# Patient Record
Sex: Male | Born: 1940 | Race: White | Hispanic: No | Smoking: Former smoker
Health system: Southern US, Community
[De-identification: ages and names within clinical notes are randomized; demographics above are authoritative.]

## PROBLEM LIST (undated history)

## (undated) DIAGNOSIS — E039 Hypothyroidism, unspecified: Secondary | ICD-10-CM

## (undated) DIAGNOSIS — E785 Hyperlipidemia, unspecified: Secondary | ICD-10-CM

## (undated) DIAGNOSIS — R413 Other amnesia: Secondary | ICD-10-CM

## (undated) DIAGNOSIS — R609 Edema, unspecified: Secondary | ICD-10-CM

## (undated) DIAGNOSIS — I639 Cerebral infarction, unspecified: Secondary | ICD-10-CM

## (undated) DIAGNOSIS — R600 Localized edema: Secondary | ICD-10-CM

## (undated) DIAGNOSIS — I2510 Atherosclerotic heart disease of native coronary artery without angina pectoris: Secondary | ICD-10-CM

## (undated) DIAGNOSIS — I10 Essential (primary) hypertension: Secondary | ICD-10-CM

## (undated) DIAGNOSIS — R4182 Altered mental status, unspecified: Secondary | ICD-10-CM

## (undated) DIAGNOSIS — E119 Type 2 diabetes mellitus without complications: Secondary | ICD-10-CM

## (undated) HISTORY — DX: Other amnesia: R41.3

## (undated) HISTORY — DX: Atherosclerotic heart disease of native coronary artery without angina pectoris: I25.10

## (undated) HISTORY — DX: Hypothyroidism, unspecified: E03.9

## (undated) HISTORY — DX: Hyperlipidemia, unspecified: E78.5

## (undated) HISTORY — DX: Edema, unspecified: R60.9

## (undated) HISTORY — PX: CORONARY STENT PLACEMENT: SHX1402

## (undated) HISTORY — DX: Type 2 diabetes mellitus without complications: E11.9

## (undated) HISTORY — DX: Essential (primary) hypertension: I10

## (undated) HISTORY — DX: Altered mental status, unspecified: R41.82

## (undated) HISTORY — DX: Cerebral infarction, unspecified: I63.9

## (undated) HISTORY — DX: Localized edema: R60.0

---

## 2013-09-21 DIAGNOSIS — N390 Urinary tract infection, site not specified: Secondary | ICD-10-CM | POA: Diagnosis not present

## 2013-09-21 DIAGNOSIS — R82998 Other abnormal findings in urine: Secondary | ICD-10-CM | POA: Diagnosis not present

## 2013-09-21 DIAGNOSIS — E785 Hyperlipidemia, unspecified: Secondary | ICD-10-CM | POA: Diagnosis not present

## 2013-09-21 DIAGNOSIS — E119 Type 2 diabetes mellitus without complications: Secondary | ICD-10-CM | POA: Diagnosis not present

## 2013-09-21 DIAGNOSIS — I10 Essential (primary) hypertension: Secondary | ICD-10-CM | POA: Diagnosis not present

## 2013-09-21 DIAGNOSIS — Z79899 Other long term (current) drug therapy: Secondary | ICD-10-CM | POA: Diagnosis not present

## 2013-10-17 DIAGNOSIS — R82998 Other abnormal findings in urine: Secondary | ICD-10-CM | POA: Diagnosis not present

## 2013-10-17 DIAGNOSIS — I2510 Atherosclerotic heart disease of native coronary artery without angina pectoris: Secondary | ICD-10-CM | POA: Diagnosis not present

## 2013-10-17 DIAGNOSIS — Z79899 Other long term (current) drug therapy: Secondary | ICD-10-CM | POA: Diagnosis not present

## 2013-11-14 DIAGNOSIS — Z0000 Encounter for general adult medical examination without abnormal findings: Secondary | ICD-10-CM | POA: Diagnosis not present

## 2013-11-14 DIAGNOSIS — N401 Enlarged prostate with lower urinary tract symptoms: Secondary | ICD-10-CM | POA: Diagnosis not present

## 2013-11-14 DIAGNOSIS — N390 Urinary tract infection, site not specified: Secondary | ICD-10-CM | POA: Diagnosis not present

## 2014-05-23 DIAGNOSIS — Z79899 Other long term (current) drug therapy: Secondary | ICD-10-CM | POA: Diagnosis not present

## 2014-05-23 DIAGNOSIS — R5383 Other fatigue: Secondary | ICD-10-CM | POA: Diagnosis not present

## 2014-05-23 DIAGNOSIS — I6350 Cerebral infarction due to unspecified occlusion or stenosis of unspecified cerebral artery: Secondary | ICD-10-CM | POA: Diagnosis not present

## 2014-05-23 DIAGNOSIS — I10 Essential (primary) hypertension: Secondary | ICD-10-CM | POA: Diagnosis not present

## 2014-05-23 DIAGNOSIS — R5381 Other malaise: Secondary | ICD-10-CM | POA: Diagnosis not present

## 2014-05-23 DIAGNOSIS — E119 Type 2 diabetes mellitus without complications: Secondary | ICD-10-CM | POA: Diagnosis not present

## 2014-05-30 DIAGNOSIS — I2510 Atherosclerotic heart disease of native coronary artery without angina pectoris: Secondary | ICD-10-CM | POA: Diagnosis not present

## 2014-05-30 DIAGNOSIS — I669 Occlusion and stenosis of unspecified cerebral artery: Secondary | ICD-10-CM | POA: Diagnosis not present

## 2014-05-30 DIAGNOSIS — E1121 Type 2 diabetes mellitus with diabetic nephropathy: Secondary | ICD-10-CM | POA: Diagnosis not present

## 2014-05-30 DIAGNOSIS — I10 Essential (primary) hypertension: Secondary | ICD-10-CM | POA: Diagnosis not present

## 2014-08-28 DIAGNOSIS — N183 Chronic kidney disease, stage 3 (moderate): Secondary | ICD-10-CM | POA: Diagnosis not present

## 2014-08-28 DIAGNOSIS — E039 Hypothyroidism, unspecified: Secondary | ICD-10-CM | POA: Diagnosis not present

## 2014-11-12 DIAGNOSIS — I2510 Atherosclerotic heart disease of native coronary artery without angina pectoris: Secondary | ICD-10-CM | POA: Diagnosis not present

## 2014-11-12 DIAGNOSIS — E039 Hypothyroidism, unspecified: Secondary | ICD-10-CM | POA: Diagnosis not present

## 2014-11-12 DIAGNOSIS — I10 Essential (primary) hypertension: Secondary | ICD-10-CM | POA: Diagnosis not present

## 2014-11-12 DIAGNOSIS — R609 Edema, unspecified: Secondary | ICD-10-CM | POA: Diagnosis not present

## 2014-11-12 DIAGNOSIS — E785 Hyperlipidemia, unspecified: Secondary | ICD-10-CM | POA: Diagnosis not present

## 2014-11-12 DIAGNOSIS — E119 Type 2 diabetes mellitus without complications: Secondary | ICD-10-CM | POA: Diagnosis not present

## 2014-11-19 ENCOUNTER — Telehealth (HOSPITAL_COMMUNITY): Payer: Self-pay | Admitting: Cardiovascular Disease

## 2014-11-19 NOTE — Telephone Encounter (Signed)
Records received from [facility] for appointment on 12/18/14 with Dr Allyson SabalBerry.  [facility] for Dr Hazle CocaBerry's schedule on 12/18/14. lp

## 2014-12-18 ENCOUNTER — Telehealth: Payer: Self-pay | Admitting: Cardiovascular Disease

## 2014-12-18 ENCOUNTER — Ambulatory Visit (INDEPENDENT_AMBULATORY_CARE_PROVIDER_SITE_OTHER): Payer: Medicare Other | Admitting: Cardiovascular Disease

## 2014-12-18 ENCOUNTER — Encounter: Payer: Self-pay | Admitting: Cardiovascular Disease

## 2014-12-18 VITALS — BP 132/72 | HR 58 | Ht 68.0 in | Wt 192.4 lb

## 2014-12-18 DIAGNOSIS — I2510 Atherosclerotic heart disease of native coronary artery without angina pectoris: Secondary | ICD-10-CM | POA: Diagnosis not present

## 2014-12-18 DIAGNOSIS — E785 Hyperlipidemia, unspecified: Secondary | ICD-10-CM | POA: Insufficient documentation

## 2014-12-18 DIAGNOSIS — I10 Essential (primary) hypertension: Secondary | ICD-10-CM | POA: Insufficient documentation

## 2014-12-18 DIAGNOSIS — I2583 Coronary atherosclerosis due to lipid rich plaque: Secondary | ICD-10-CM

## 2014-12-18 NOTE — Telephone Encounter (Signed)
Faxed signed Release to [facility] to obtain records per Dr Allyson SabalBerry request.  Faxed on 12/18/14. lp

## 2014-12-18 NOTE — Assessment & Plan Note (Signed)
History of hypertension with blood pressure measured at 132/72. He is on amlodipine, carvedilol and enalapril. Continue current meds at current dosing

## 2014-12-18 NOTE — Progress Notes (Signed)
12/18/2014 Leroy Rodriguez   1941/02/05  [id number]  Primary Physician No primary care provider on file. Primary Cardiologist: Leroy GessJonathan J. Teruko Joswick MD Leroy Rodriguez,FACC,FAHA, FSCAI   HPI:  Leroy Rodriguez is a 74 year old moderately overweight widowed Caucasian male father of 2 children (daughter [name] , son Leroy Rodriguez both of whom accompany him today), grandfather of 8 grandchildren who was referred for cardiovascular evaluation to be established practice. He has a history of coronary artery stenting on 2 separate occasions in 2010 by Dr. Juanita [facility]. His cardiac risk factors include treated hypertension, diabetes and hyperlipidemia. There is no family history. He has never had a heart attack or stroke. He denies chest pain or shortness of breath. He does have memory loss and may have vascular dementia which needs to be worked up.   Current Outpatient Prescriptions  Medication Sig Dispense Refill  . amLODipine (NORVASC) 10 MG tablet Take 1 tablet by mouth daily.    . carvedilol (COREG) 12.5 MG tablet Take 1 tablet by mouth daily.    . clopidogrel (PLAVIX) 75 MG tablet Take 1 tablet by mouth daily.  2  . enalapril (VASOTEC) 20 MG tablet Take 1 tablet by mouth daily.    . furosemide (LASIX) 20 MG tablet Take 1 tablet by mouth daily as needed.    Marland Kitchen. glimepiride (AMARYL) 2 MG tablet Take 1 tablet by mouth daily.    Marland Kitchen. levothyroxine (SYNTHROID, LEVOTHROID) 25 MCG tablet Take 1 tablet by mouth daily.  0  . potassium chloride (MICRO-K) 10 MEQ CR capsule Take 1 capsule by mouth daily.    . pravastatin (PRAVACHOL) 80 MG tablet Take 1 tablet by mouth daily.     No current facility-administered medications for this visit.    No Known Allergies  History   Social History  . Marital Status: Widowed    Spouse Name: N/A  . Number of Children: N/A  . Years of Education: N/A   Occupational History  . Not on file.   Social History Main Topics  . Smoking status: Former Smoker    Quit date: 08/31/1963    . Smokeless tobacco: Not on file  . Alcohol Use: No  . Drug Use: No  . Sexual Activity: Not on file   Other Topics Concern  . Not on file   Social History Narrative  . No narrative on file     Review of Systems: General: negative for chills, fever, night sweats or weight changes.  Cardiovascular: negative for chest pain, dyspnea on exertion, edema, orthopnea, palpitations, paroxysmal nocturnal dyspnea or shortness of breath Dermatological: negative for rash Respiratory: negative for cough or wheezing Urologic: negative for hematuria Abdominal: negative for nausea, vomiting, diarrhea, bright red blood per rectum, melena, or hematemesis Neurologic: negative for visual changes, syncope, or dizziness All other systems reviewed and are otherwise negative except as noted above.    Blood pressure 132/72, pulse 58, height 5\' 8"  (1.727 m), weight 192 lb 6.4 oz (87.272 kg).  General appearance: alert and no distress Neck: no adenopathy, no carotid bruit, no JVD, supple, symmetrical, trachea midline and thyroid not enlarged, symmetric, no tenderness/mass/nodules Lungs: clear to auscultation bilaterally Heart: regular rate and rhythm, S1, S2 normal, no murmur, click, rub or gallop Extremities: extremities normal, atraumatic, no cyanosis or edema  EKG sinus bradycardia at 58 without ST or T-wave changes. I placed a reviewed this EKG  ASSESSMENT AND PLAN:   Hyperlipidemia History of hyperlipidemia on pravastatin 80 mg a day followed by his  PCP   Essential hypertension History of hypertension with blood pressure measured at 132/72. He is on amlodipine, carvedilol and enalapril. Continue current meds at current dosing   Coronary artery disease History of coronary artery disease status post intervention in [place] by Dr. Juanita Laster  in 2010 with 2 stents placed. He has not had a myocardial infarction by his account. He denies chest pain or shortness of breath. We will obtain his  procedure records for documentation. He is on dual antibiotic therapy in addition to a beta blocker.       Leroy Gess MD FACP,FACC,FAHA, [facility] 12/18/2014 3:42 PM

## 2014-12-18 NOTE — Assessment & Plan Note (Signed)
History of coronary artery disease status post intervention in [place] by Dr. Juanita LasterWalmeyer  in 2010 with 2 stents placed. He has not had a myocardial infarction by his account. He denies chest pain or shortness of breath. We will obtain his procedure records for documentation. He is on dual antibiotic therapy in addition to a beta blocker.

## 2014-12-18 NOTE — Patient Instructions (Signed)
Dr Berry recommends that you schedule a follow-up appointment in 1 year. You will receive a reminder letter in the mail two months in advance. If you don't receive a letter, please call our office to schedule the follow-up appointment. 

## 2014-12-18 NOTE — Assessment & Plan Note (Signed)
History of hyperlipidemia on pravastatin 80 mg a day followed by his PCP 

## 2014-12-19 ENCOUNTER — Telehealth: Payer: Self-pay | Admitting: Cardiovascular Disease

## 2014-12-19 NOTE — Telephone Encounter (Signed)
Received records from [facility] per request by Dr Allyson SabalBerry on 12/18/14.  Records given to Dr Allyson SabalBerry for review.

## 2015-01-07 DIAGNOSIS — E785 Hyperlipidemia, unspecified: Secondary | ICD-10-CM | POA: Diagnosis not present

## 2015-01-07 DIAGNOSIS — R413 Other amnesia: Secondary | ICD-10-CM | POA: Diagnosis not present

## 2015-01-07 DIAGNOSIS — R609 Edema, unspecified: Secondary | ICD-10-CM | POA: Diagnosis not present

## 2015-01-07 DIAGNOSIS — I2510 Atherosclerotic heart disease of native coronary artery without angina pectoris: Secondary | ICD-10-CM | POA: Diagnosis not present

## 2015-01-07 DIAGNOSIS — I10 Essential (primary) hypertension: Secondary | ICD-10-CM | POA: Diagnosis not present

## 2015-01-07 DIAGNOSIS — E119 Type 2 diabetes mellitus without complications: Secondary | ICD-10-CM | POA: Diagnosis not present

## 2015-01-07 DIAGNOSIS — R4182 Altered mental status, unspecified: Secondary | ICD-10-CM | POA: Diagnosis not present

## 2015-01-07 DIAGNOSIS — E039 Hypothyroidism, unspecified: Secondary | ICD-10-CM | POA: Diagnosis not present

## 2015-01-09 ENCOUNTER — Other Ambulatory Visit: Payer: Self-pay | Admitting: Family Medicine

## 2015-01-09 DIAGNOSIS — R4182 Altered mental status, unspecified: Secondary | ICD-10-CM

## 2015-01-18 ENCOUNTER — Ambulatory Visit
Admission: RE | Admit: 2015-01-18 | Discharge: 2015-01-18 | Disposition: A | Discharge status: Home / Self Care | Payer: Medicare Other | Source: Ambulatory Visit | Attending: Family Medicine | Admitting: Family Medicine

## 2015-01-18 DIAGNOSIS — R4182 Altered mental status, unspecified: Secondary | ICD-10-CM

## 2015-01-18 DIAGNOSIS — R410 Disorientation, unspecified: Secondary | ICD-10-CM | POA: Diagnosis not present

## 2015-01-18 DIAGNOSIS — R413 Other amnesia: Secondary | ICD-10-CM | POA: Diagnosis not present

## 2015-01-29 ENCOUNTER — Encounter: Payer: Self-pay | Admitting: Neurology

## 2015-01-29 ENCOUNTER — Ambulatory Visit (INDEPENDENT_AMBULATORY_CARE_PROVIDER_SITE_OTHER): Payer: Medicare Other | Admitting: Neurology

## 2015-01-29 VITALS — BP 132/72 | HR 62 | Resp 16 | Ht 68.0 in | Wt 200.0 lb

## 2015-01-29 DIAGNOSIS — R29818 Other symptoms and signs involving the nervous system: Secondary | ICD-10-CM | POA: Diagnosis not present

## 2015-01-29 DIAGNOSIS — R2689 Other abnormalities of gait and mobility: Secondary | ICD-10-CM

## 2015-01-29 DIAGNOSIS — Z955 Presence of coronary angioplasty implant and graft: Secondary | ICD-10-CM

## 2015-01-29 DIAGNOSIS — I2510 Atherosclerotic heart disease of native coronary artery without angina pectoris: Secondary | ICD-10-CM | POA: Diagnosis not present

## 2015-01-29 DIAGNOSIS — F0151 Vascular dementia with behavioral disturbance: Secondary | ICD-10-CM | POA: Diagnosis not present

## 2015-01-29 MED ORDER — DONEPEZIL HCL 5 MG PO TABS: 5.0000 mg | ORAL_TABLET | Freq: Every day | ORAL | Status: DC

## 2015-01-29 NOTE — Progress Notes (Signed)
Subjective:    Patient ID: Leroy Rodriguez is a 74 y.o. male.  HPI     Huston Foley, MD, PhD [facility] [address], [address] [address] [place], [place] [address]  Dear Dr. Azucena Cecil,   I saw your patient, Leroy Rodriguez, upon your kind request in my neurologic clinic today for initial consultation of his memory loss, associated with behavioral changes. The patient is accompanied by his daughter, Leroy Rodriguez and son, Leroy Rodriguez today. As you know, [name] is a 74 year old right-handed gentleman with an underlying medical history of coronary artery disease, status post stent placement in 2010, hypertension, hyperlipidemia, hypothyroidism, diabetes, remote history of smoking, and history of obesity, who has had memory loss for the past few years. More recently, in the past few weeks he has had behavioral escalations including physically lashing out. I reviewed your office note from 01/07/2015 which you kindly included. He had blood work on 01/07/2015 which I reviewed: CBC with differential was unremarkable, CMP showed glucose of 59, chloride borderline at 108, and otherwise unremarkable findings. B12 level was 389, TSH 3.01, RPR nonreactive, hemoglobin A1c on 11/12/2014 was 5.7. In your office, his MMSE score on 01/07/2015 was 10 out of 30. He had a brain MRI without contrast on 01/18/2015: Impression moderate atrophy and moderate chronic microvascular ischemia, multiple areas of chronic microhemorrhage, no acute abnormality. In addition, personally reviewed the images through the PACS system and agree with the findings.  He denies hallucinations or paranoia.  He does not drink alcohol and never was a heavy drinker.  He snores occasionally. He does not have any apneic spells. He sleeps a lot during the day according to [place]. He lives with his daughter and her husband and Leroy Rodriguez lives with them as well. He does not drive. There may be a family history of Alzheimer's disease and perhaps  vascular dementia as well. This is on his mother's side and includes maternal aunts and his own sister who is younger. He has never had stroke symptoms but was told in the past that he had smaller strokes in the past. He has never been on dementia medications. His balance has become worse. Thankfully he has not fallen recently. He walks by holding onto things. He has been referred to physical therapy and has an appointment [facility] with [facility] later this month. He is divorced from his first wife. His second wife passed away about 3-4 years ago. This is when he started living with Leroy Rodriguez and they moved to live with Faith about 3 years ago.  His Past Medical History Is Significant For: Past Medical History  Diagnosis Date  . Coronary artery disease   . Hypertension   . Hyperlipidemia   . Type 2 diabetes mellitus   . Memory loss   . Stroke   . Hypothyroidism   . Hyperlipemia   . Peripheral edema   . Mental status change   . CAD (coronary artery disease)     His Past Surgical History Is Significant For: Past Surgical History  Procedure Laterality Date  . Coronary stent placement      His Family History Is Significant For: Family History  Problem Relation Age of Onset  . Stroke Mother   . Aneurysm Father     His Social History Is Significant For: History   Social History  . Marital Status: Widowed    Spouse Name: N/A  . Number of Children: 2  . Years of Education: [employer]  Occupational History  . Retired     Social History Main Topics  . Smoking status: Former Smoker    Quit date: 08/31/1963  . Smokeless tobacco: Not on file  . Alcohol Use: No  . Drug Use: No  . Sexual Activity: Not on file   Other Topics Concern  . None   Social History Narrative   Denies caffeine use    His Allergies Are:  No Known Allergies:   His Current Medications Are:  Outpatient Encounter Prescriptions as of 01/29/2015  Medication Sig  . amLODipine (NORVASC)  10 MG tablet Take 1 tablet by mouth daily.  . carvedilol (COREG) 12.5 MG tablet Take 1 tablet by mouth daily.  . clopidogrel (PLAVIX) 75 MG tablet Take 1 tablet by mouth daily.  . enalapril (VASOTEC) 20 MG tablet Take 1 tablet by mouth daily.  . furosemide (LASIX) 20 MG tablet Take 1 tablet by mouth daily as needed.  Marland Kitchen glimepiride (AMARYL) 2 MG tablet Take 1 tablet by mouth daily.  Marland Kitchen levothyroxine (SYNTHROID, LEVOTHROID) 25 MCG tablet Take 1 tablet by mouth daily.  . potassium chloride (MICRO-K) 10 MEQ CR capsule Take 1 capsule by mouth daily.  . pravastatin (PRAVACHOL) 80 MG tablet Take 1 tablet by mouth daily.   No facility-administered encounter medications on file as of 01/29/2015.  :  Review of Systems:  Out of a complete 14 point review of systems, all are reviewed and negative with the exception of these symptoms as listed below:   Review of Systems  Constitutional: Positive for fatigue.  Neurological:       Short term memory loss, confusion, sleepiness.   Hematological: Bruises/bleeds easily.    Objective:  Neurologic Exam  Physical Exam Physical Examination:   Filed Vitals:   01/29/15 1411  BP: 132/72  Pulse: 62  Resp: 16   General Examination: The patient is a very pleasant 74 y.o. male in no acute distress. He is calm and cooperative with the exam. He denies Auditory Hallucinations and Visual Hallucinations. He is well groomed and situated in a chair.   HEENT: Normocephalic, atraumatic, pupils are equal, round and reactive to light and accommodation. Funduscopic exam is normal with sharp disc margins noted. Extraocular tracking shows mild saccadic breakdown without nystagmus noted. Hearing is mildly impaired. Face is symmetric with mild facial masking and normal facial sensation. There is no lip, neck or jaw tremor. Neck is not rigid with intact passive ROM. There are no carotid bruits on auscultation. Oropharynx exam reveals mild mouth dryness. No significant airway  crowding is noted. He has no teeth in his upper jaw. He misplaced or lost his dentures. He has several missing teeth in his lower jaw with poor dental hygiene. Mallampati is class II. Tongue protrudes centrally and palate elevates symmetrically.    Chest: is clear to auscultation without wheezing, rhonchi or crackles noted.  Heart: sounds are regular and normal without murmurs, rubs or gallops noted.   Abdomen: is soft, non-tender and non-distended with normal bowel sounds appreciated on auscultation.  Extremities: There is 1+ pitting edema in the distal lower extremities bilaterally. Pedal pulses are intact.   Skin: is warm and dry with no trophic changes noted. Age-related changes are noted on the skin. Multiple small bruises of varying ages are noted on the forearms. Of note, He is on plavix.  Musculoskeletal: exam reveals no obvious joint deformities, tenderness or joint swelling or erythema. 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   Neurologically:  Mental status: The patient is awake and alert, paying good  attention. He is able to partially provide the history. His children provide most of his history. His is not able to give the exact date, the day of the week, the season, the month or the year. Memory, language and knowledge are impaired. There is no aphasia, agnosia, apraxia or anomia. There is a mild degree of bradyphrenia. Speech is mildly hypophonic with no dysarthria noted, but is edentulous sounding. Mood is congruent and affect is normal  On 01/29/2015: MMSE 20/30, CDT: 3/4, AFT: 5/min.   Cranial nerves are as described above under HEENT exam. In addition, shoulder shrug is normal with equal shoulder height noted.  Motor exam: Normal bulk, and strength for age is noted. Tone is normal. Fine motor skills are globally impaired, mildly. Reflexes are 1-2+ throughout. Romberg is not tested.   Sensory exam is intact to light touch, pinprick, vibration,  temperature sense in the upper and lower extremities.   Gait, station and balance: He stands up with difficulty. He pushes himself up. His posture is moderately stooped. He stands wide-based. He has difficulty walking. He is unstable and not able to do tandem walk. He cannot walk for more than a few steps without holding onto something. Balance is impaired.   Assessment and Plan:   In summary, Quatavious Rossa is a very pleasant 73 y.o.-year old male with an underlying medical history of coronary artery disease, status post stent placement in 2010, hypertension, hyperlipidemia, hypothyroidism, diabetes, remote history of smoking, and history of obesity, who has had memory loss for the past few years. Lately he has had some behavioral changes. given his medical history, he is at risk for vascular dementia. I had a long chat with the patient and his family about my findings and the diagnosis of memory loss as well as dementia and the prognosis and treatment options. While there is no specific treatment for vascular dementia, we sometimes try dementia medication and some patients improve. I did talk to him about potential side effects such as mouth dryness, blurry vision, hallucinations, worsening personality changes or behavioral changes, balance problems and GI problems as well as bradycardia. We will start low dose Aricept at 5 mg once daily. I provided them with instructions and a prescription. We will continue to monitor for behavioral changes and his memory loss. I reviewed his MRI scan. I explained the findings to them. I would like to add a carotid Doppler ultrasound and we will call his son with the results. He spends the most time with the patient. I would like to see him back routinely in about 3-4 months, sooner if the need arises. I've asked his son to give [place] an update over the phone or via my chart in about a month as to how the patient is tolerating the new medication. I reviewed your office note  and recent blood work in your office. He is encouraged to eat healthy, exercise daily and keep well hydrated, to keep a scheduled bedtime and wake time routine, to not skip any meals and eat healthy snacks in between meals and to have protein with every meal. I stressed the importance of regular exercise, within of course the patient's own mobility limitations. I encouraged the patient to keep up with current events by reading the news paper or watching the news and to do word puzzles, or if feasible, to go on [employer].  his balance is off. His gait disorder may be multifactorial. He  has been referred to physical therapy with an appointment pending for later this month. He may do better with a walker.  I answered all their questions today and the patient and his children were in agreement with the above outlined plan.  Thank you very much for allowing me to participate in the care of this nice patient. If I can be of any further assistance to you please do not hesitate to call me at [phone].  Sincerely,   Huston FoleySaima Samar Venneman, MD, PhD

## 2015-01-29 NOTE — Patient Instructions (Addendum)
We will do a carotid doppler ultrasound and call Scott with the results.  You are at risk for vascular dementia. We will try Aricept (generic name: donepezil) 5 mg: take one pill each evening. Common side effects include dry eyes, dry mouth, confusion, low pulse, low blood pressure and rare side effects include hallucinations.  Scott: please call in a month for an update, and we may be able to increase the dose to 10 mg daily, if tolerated.   I do want to suggest a few things today:  Remember to drink plenty of fluid, eat healthy meals and do not skip any meals. Try to eat protein with a every meal and eat a healthy snack such as fruit or nuts in between meals. Try to keep a regular sleep-wake schedule and try to exercise daily, particularly in the form of walking, 20-30 minutes a day, if you can. Good nutrition, proper sleep and exercise can help her cognitive function.  Look at [employer] website, they may have some options for socialization for seniors.   Engage in social activities in your community and with your family and try to keep up with current events by reading the newspaper or watching the news. If you have computer and can go online, try [employer]pllumosity[employer]com. Also, you may like to do word finding puzzles or crossword puzzles.  As far as diagnostic testing: carotid doppler study.   I would like to see you back in 3 to 4 months, sooner if we need to. Please call us with any interim questions, concerns, problems, updates or refill requests.  Our phone number is [phone]. We also have an after hours call service for urgent matters and there is a physician on-call for urgent questions. For any emergencies you know to call 911 or go to the nearest emergency room.

## 2015-02-05 ENCOUNTER — Telehealth: Payer: Self-pay

## 2015-02-05 NOTE — Telephone Encounter (Signed)
Called Patient, spoke with daughter and  scheduled US Carotid.  Family member verbalized understanding.

## 2015-02-20 ENCOUNTER — Ambulatory Visit (INDEPENDENT_AMBULATORY_CARE_PROVIDER_SITE_OTHER): Payer: Medicare Other

## 2015-02-20 ENCOUNTER — Ambulatory Visit: Payer: Medicare Other | Attending: Family Medicine | Admitting: Physical Therapy

## 2015-02-20 DIAGNOSIS — R269 Unspecified abnormalities of gait and mobility: Secondary | ICD-10-CM | POA: Insufficient documentation

## 2015-02-20 DIAGNOSIS — F0151 Vascular dementia with behavioral disturbance: Secondary | ICD-10-CM | POA: Diagnosis not present

## 2015-02-20 DIAGNOSIS — Z955 Presence of coronary angioplasty implant and graft: Secondary | ICD-10-CM

## 2015-02-21 ENCOUNTER — Encounter: Payer: Self-pay | Admitting: Physical Therapy

## 2015-02-21 NOTE — Therapy (Signed)
[facility] [address] [place], [place], [address] Phone: [phone]   Fax:  [phone]  Physical Therapy Evaluation  Patient Details  Name: Leroy Rodriguez MRN: [id number] Date of Birth: [birth year] Referring Provider:  Tally Joe, MD  Encounter Date: 02/20/2015      PT End of Session - 02/21/15 0904    Visit Number 1  G1   Number of Visits 8   Date for PT Re-Evaluation 03/22/15   Authorization Type Medicare   Authorization Time Period 02-20-15 - 04-21-15   Authorization - Number of Visits 8   PT Start Time 0932   PT Stop Time 1025   PT Time Calculation (min) 53 min   Equipment Utilized During Treatment Gait belt      Past Medical History  Diagnosis Date  . Coronary artery disease   . Hypertension   . Hyperlipidemia   . Type 2 diabetes mellitus   . Memory loss   . Stroke   . Hypothyroidism   . Hyperlipemia   . Peripheral edema   . Mental status change   . CAD (coronary artery disease)     Past Surgical History  Procedure Laterality Date  . Coronary stent placement      There were no vitals filed for this visit.  Visit Diagnosis:  Abnormality of gait - Plan: PT plan of care cert/re-cert      Subjective Assessment - 02/21/15 0900    Subjective Pt. is accompanied by his daughter and son - daughter reports they want to know what device is needed to help pt maintain balance and not fall; some confusion on referral as one requests wheelchair evaluation and one requests PT for unsrteady gait   Patient is accompained by: Family member  daughter and son   Pertinent History HTN:  vascular dementia   Patient Stated Goals obtain a RW and learn how to use correctly; pt. states he does not need a wheelchair at this time - his duaghter and son agree that only RW is needed - no w/c   Currently in Pain? 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 --  several episodes of unsteadiness   Has the patient had a decrease in activity level because of a fear of falling?  Yes   Is the patient reluctant to leave their home because of a fear of falling?  No   Home Environment   Living Environment Private residence   Living Arrangements Children  son provides 24 hr S   Type of Home House   Home Access Stairs to enter   Entrance Stairs-Number of Steps 4   Entrance Stairs-Rails Right   Home Layout One level   Home Equipment --  grab bar in bathroom   Strength   Overall Strength Within functional limits for tasks performed   Ambulation/Gait   Ambulation/Gait Yes   Ambulation/Gait Assistance 4: Min guard   Ambulation Distance (Feet) 225 Feet   Assistive device Rolling walker   Ambulation Surface Level;Indoor   Gait velocity --  28.65 secs with RW   Sharlene Motts Balance  Test   Sit to Stand Needs minimal aid to stand or to stabilize   Standing Unsupported Able to stand 2 minutes with supervision   Sitting with Back Unsupported but Feet Supported on Floor or Stool Able to sit safely and securely 2 minutes   Stand to Sit Sits safely with minimal use of hands   Transfers Able to transfer safely, definite need of hands   Standing Unsupported with Eyes Closed Able to stand 10 seconds with supervision   Standing Ubsupported with Feet Together Needs help to attain position but able to stand for 30 seconds with feet together   From Standing, Reach Forward with Outstretched Arm Can reach forward >12 cm safely (5")   From Standing Position, Pick up Object from Floor Unable to try/needs assist to keep balance   From Standing Position, Turn to Look Behind Over each Shoulder Turn sideways only but maintains balance   Turn  360 Degrees Needs assistance while turning   Standing Unsupported, Alternately Place Feet on Step/Stool Able to complete >2 steps/needs minimal assist   Standing Unsupported, One Foot in Front Able to take small step independently and hold 30 seconds   Standing on One Leg Unable to try or needs assist to prevent fall   Total Score 27   Timed Up and Go Test   Normal TUG (seconds) 27.69  with RW                                PT Long Term Goals - 02/21/15 0909    PT LONG TERM GOAL #1   Title Pt. will amb. 250' with RW with SBA for safety with household amb.  (03-22-15)   Time 4   Period Weeks   Status New   PT LONG TERM GOAL #2   Title Pt. will demonstrate correct hand placement with sit to/from transfers with RW with min verbal cues  (03-22-15)   Time 4   Period Weeks   Status New   PT LONG TERM GOAL #3   Title Perform HEP for balance with son's assistance  (03-22-15)   Time 4   Period Weeks   Status New               Plan - 02/21/15 4098    Clinical Impression Statement Pt. needs a RW to assist with balance to minimize fall risk during gait; pt. does not require a wheelchair at this time due to pt is able to amb. in his home safely with use of RW   Pt will benefit from skilled therapeutic intervention in order to improve on the following deficits Abnormal gait;Decreased activity tolerance;Decreased balance;Decreased cognition;Decreased mobility;Decreased knowledge of use of DME;Decreased safety awareness   Rehab Potential Good   PT Frequency 2x / week   PT Duration 4 weeks   PT Treatment/Interventions ADLs/Self Care Home Management;Functional mobility training;Stair training;Gait training;Therapeutic activities;Therapeutic exercise;Balance training;Neuromuscular re-education;Patient/family education   PT Next Visit Plan gait train with RW; balance HEP   PT Home Exercise Plan balance   Consulted and Agree with Plan of Care Patient;Family  member/caregiver   Family Member Consulted daughter and son          G-Codes - 03-05-2015 0912    Functional Assessment Tool Used Sharlene Motts score 27/56;  TUG with RW 27.69 secs;  pt. currently does not have an asst. device - is at high risk for falls   Functional Limitation Mobility:  Walking and moving around   Mobility: Walking and Moving Around Current Status [phone]) At least 60 percent but less than 80 percent impaired, limited or restricted   Mobility: Walking and Moving Around Goal Status [phone]) At least 40 percent but less than 60 percent impaired, limited or restricted       Problem List Patient Active Problem List   Diagnosis Date Noted  . Coronary artery disease 12/18/2014  . Essential hypertension 12/18/2014  . Hyperlipidemia 12/18/2014    Kary Kos, PT 02/21/2015, 9:18 AM  [facility] [address] [place], [place], [address] Phone: [phone]   Fax:  [phone]

## 2015-03-17 ENCOUNTER — Telehealth: Payer: Self-pay | Admitting: Neurology

## 2015-03-17 ENCOUNTER — Telehealth: Payer: Self-pay

## 2015-03-17 NOTE — Telephone Encounter (Signed)
Left message on vm number provided with results below. Left call back number for further questions.

## 2015-03-17 NOTE — Telephone Encounter (Signed)
Please call pt, his daughter or his son, re: Carotid Doppler results. Study suggests no significant carotid artery disease, which is reassuring. No further action required on this test at this time.

## 2015-03-17 NOTE — Telephone Encounter (Signed)
Faith, patient's daughter, just wanted to let you know that they have seen some positive changes in [place] since starting Aricept. They feel no need for dosage change at this time.

## 2015-03-18 DIAGNOSIS — E119 Type 2 diabetes mellitus without complications: Secondary | ICD-10-CM | POA: Diagnosis not present

## 2015-03-18 DIAGNOSIS — Z8673 Personal history of transient ischemic attack (TIA), and cerebral infarction without residual deficits: Secondary | ICD-10-CM | POA: Diagnosis not present

## 2015-03-18 DIAGNOSIS — I10 Essential (primary) hypertension: Secondary | ICD-10-CM | POA: Diagnosis not present

## 2015-03-18 DIAGNOSIS — R2689 Other abnormalities of gait and mobility: Secondary | ICD-10-CM | POA: Diagnosis not present

## 2015-03-18 DIAGNOSIS — F0150 Vascular dementia without behavioral disturbance: Secondary | ICD-10-CM | POA: Diagnosis not present

## 2015-03-18 DIAGNOSIS — I2510 Atherosclerotic heart disease of native coronary artery without angina pectoris: Secondary | ICD-10-CM | POA: Diagnosis not present

## 2015-03-20 DIAGNOSIS — Z8673 Personal history of transient ischemic attack (TIA), and cerebral infarction without residual deficits: Secondary | ICD-10-CM | POA: Diagnosis not present

## 2015-03-20 DIAGNOSIS — I10 Essential (primary) hypertension: Secondary | ICD-10-CM | POA: Diagnosis not present

## 2015-03-20 DIAGNOSIS — E119 Type 2 diabetes mellitus without complications: Secondary | ICD-10-CM | POA: Diagnosis not present

## 2015-03-20 DIAGNOSIS — I2510 Atherosclerotic heart disease of native coronary artery without angina pectoris: Secondary | ICD-10-CM | POA: Diagnosis not present

## 2015-03-20 DIAGNOSIS — F0150 Vascular dementia without behavioral disturbance: Secondary | ICD-10-CM | POA: Diagnosis not present

## 2015-03-20 DIAGNOSIS — R2689 Other abnormalities of gait and mobility: Secondary | ICD-10-CM | POA: Diagnosis not present

## 2015-03-25 DIAGNOSIS — I10 Essential (primary) hypertension: Secondary | ICD-10-CM | POA: Diagnosis not present

## 2015-03-25 DIAGNOSIS — F0150 Vascular dementia without behavioral disturbance: Secondary | ICD-10-CM | POA: Diagnosis not present

## 2015-03-25 DIAGNOSIS — R2689 Other abnormalities of gait and mobility: Secondary | ICD-10-CM | POA: Diagnosis not present

## 2015-03-25 DIAGNOSIS — I2510 Atherosclerotic heart disease of native coronary artery without angina pectoris: Secondary | ICD-10-CM | POA: Diagnosis not present

## 2015-03-25 DIAGNOSIS — E119 Type 2 diabetes mellitus without complications: Secondary | ICD-10-CM | POA: Diagnosis not present

## 2015-03-25 DIAGNOSIS — Z8673 Personal history of transient ischemic attack (TIA), and cerebral infarction without residual deficits: Secondary | ICD-10-CM | POA: Diagnosis not present

## 2015-03-27 DIAGNOSIS — F0150 Vascular dementia without behavioral disturbance: Secondary | ICD-10-CM | POA: Diagnosis not present

## 2015-03-27 DIAGNOSIS — E119 Type 2 diabetes mellitus without complications: Secondary | ICD-10-CM | POA: Diagnosis not present

## 2015-03-27 DIAGNOSIS — Z8673 Personal history of transient ischemic attack (TIA), and cerebral infarction without residual deficits: Secondary | ICD-10-CM | POA: Diagnosis not present

## 2015-03-27 DIAGNOSIS — I10 Essential (primary) hypertension: Secondary | ICD-10-CM | POA: Diagnosis not present

## 2015-03-27 DIAGNOSIS — R2689 Other abnormalities of gait and mobility: Secondary | ICD-10-CM | POA: Diagnosis not present

## 2015-03-27 DIAGNOSIS — I2510 Atherosclerotic heart disease of native coronary artery without angina pectoris: Secondary | ICD-10-CM | POA: Diagnosis not present

## 2015-03-31 DIAGNOSIS — Z8673 Personal history of transient ischemic attack (TIA), and cerebral infarction without residual deficits: Secondary | ICD-10-CM | POA: Diagnosis not present

## 2015-03-31 DIAGNOSIS — F0150 Vascular dementia without behavioral disturbance: Secondary | ICD-10-CM | POA: Diagnosis not present

## 2015-03-31 DIAGNOSIS — I10 Essential (primary) hypertension: Secondary | ICD-10-CM | POA: Diagnosis not present

## 2015-03-31 DIAGNOSIS — I2510 Atherosclerotic heart disease of native coronary artery without angina pectoris: Secondary | ICD-10-CM | POA: Diagnosis not present

## 2015-03-31 DIAGNOSIS — R2689 Other abnormalities of gait and mobility: Secondary | ICD-10-CM | POA: Diagnosis not present

## 2015-03-31 DIAGNOSIS — E119 Type 2 diabetes mellitus without complications: Secondary | ICD-10-CM | POA: Diagnosis not present

## 2015-04-01 DIAGNOSIS — I10 Essential (primary) hypertension: Secondary | ICD-10-CM | POA: Diagnosis not present

## 2015-04-01 DIAGNOSIS — I2510 Atherosclerotic heart disease of native coronary artery without angina pectoris: Secondary | ICD-10-CM | POA: Diagnosis not present

## 2015-04-01 DIAGNOSIS — E119 Type 2 diabetes mellitus without complications: Secondary | ICD-10-CM | POA: Diagnosis not present

## 2015-04-01 DIAGNOSIS — Z8673 Personal history of transient ischemic attack (TIA), and cerebral infarction without residual deficits: Secondary | ICD-10-CM | POA: Diagnosis not present

## 2015-04-01 DIAGNOSIS — F0150 Vascular dementia without behavioral disturbance: Secondary | ICD-10-CM | POA: Diagnosis not present

## 2015-04-01 DIAGNOSIS — R2689 Other abnormalities of gait and mobility: Secondary | ICD-10-CM | POA: Diagnosis not present

## 2015-04-04 DIAGNOSIS — R2689 Other abnormalities of gait and mobility: Secondary | ICD-10-CM | POA: Diagnosis not present

## 2015-04-04 DIAGNOSIS — I10 Essential (primary) hypertension: Secondary | ICD-10-CM | POA: Diagnosis not present

## 2015-04-04 DIAGNOSIS — F0150 Vascular dementia without behavioral disturbance: Secondary | ICD-10-CM | POA: Diagnosis not present

## 2015-04-04 DIAGNOSIS — I2510 Atherosclerotic heart disease of native coronary artery without angina pectoris: Secondary | ICD-10-CM | POA: Diagnosis not present

## 2015-04-04 DIAGNOSIS — Z8673 Personal history of transient ischemic attack (TIA), and cerebral infarction without residual deficits: Secondary | ICD-10-CM | POA: Diagnosis not present

## 2015-04-04 DIAGNOSIS — E119 Type 2 diabetes mellitus without complications: Secondary | ICD-10-CM | POA: Diagnosis not present

## 2015-04-07 DIAGNOSIS — E119 Type 2 diabetes mellitus without complications: Secondary | ICD-10-CM | POA: Diagnosis not present

## 2015-04-07 DIAGNOSIS — I2510 Atherosclerotic heart disease of native coronary artery without angina pectoris: Secondary | ICD-10-CM | POA: Diagnosis not present

## 2015-04-07 DIAGNOSIS — I10 Essential (primary) hypertension: Secondary | ICD-10-CM | POA: Diagnosis not present

## 2015-04-07 DIAGNOSIS — Z8673 Personal history of transient ischemic attack (TIA), and cerebral infarction without residual deficits: Secondary | ICD-10-CM | POA: Diagnosis not present

## 2015-04-07 DIAGNOSIS — R2689 Other abnormalities of gait and mobility: Secondary | ICD-10-CM | POA: Diagnosis not present

## 2015-04-07 DIAGNOSIS — F0150 Vascular dementia without behavioral disturbance: Secondary | ICD-10-CM | POA: Diagnosis not present

## 2015-04-08 DIAGNOSIS — Z8673 Personal history of transient ischemic attack (TIA), and cerebral infarction without residual deficits: Secondary | ICD-10-CM | POA: Diagnosis not present

## 2015-04-08 DIAGNOSIS — I10 Essential (primary) hypertension: Secondary | ICD-10-CM | POA: Diagnosis not present

## 2015-04-08 DIAGNOSIS — I2510 Atherosclerotic heart disease of native coronary artery without angina pectoris: Secondary | ICD-10-CM | POA: Diagnosis not present

## 2015-04-08 DIAGNOSIS — F0150 Vascular dementia without behavioral disturbance: Secondary | ICD-10-CM | POA: Diagnosis not present

## 2015-04-08 DIAGNOSIS — E119 Type 2 diabetes mellitus without complications: Secondary | ICD-10-CM | POA: Diagnosis not present

## 2015-04-08 DIAGNOSIS — R2689 Other abnormalities of gait and mobility: Secondary | ICD-10-CM | POA: Diagnosis not present

## 2015-04-10 DIAGNOSIS — I2510 Atherosclerotic heart disease of native coronary artery without angina pectoris: Secondary | ICD-10-CM | POA: Diagnosis not present

## 2015-04-10 DIAGNOSIS — R2689 Other abnormalities of gait and mobility: Secondary | ICD-10-CM | POA: Diagnosis not present

## 2015-04-10 DIAGNOSIS — I10 Essential (primary) hypertension: Secondary | ICD-10-CM | POA: Diagnosis not present

## 2015-04-10 DIAGNOSIS — F0150 Vascular dementia without behavioral disturbance: Secondary | ICD-10-CM | POA: Diagnosis not present

## 2015-04-10 DIAGNOSIS — Z8673 Personal history of transient ischemic attack (TIA), and cerebral infarction without residual deficits: Secondary | ICD-10-CM | POA: Diagnosis not present

## 2015-04-10 DIAGNOSIS — E119 Type 2 diabetes mellitus without complications: Secondary | ICD-10-CM | POA: Diagnosis not present

## 2015-04-14 DIAGNOSIS — Z8673 Personal history of transient ischemic attack (TIA), and cerebral infarction without residual deficits: Secondary | ICD-10-CM | POA: Diagnosis not present

## 2015-04-14 DIAGNOSIS — I2510 Atherosclerotic heart disease of native coronary artery without angina pectoris: Secondary | ICD-10-CM | POA: Diagnosis not present

## 2015-04-14 DIAGNOSIS — F0150 Vascular dementia without behavioral disturbance: Secondary | ICD-10-CM | POA: Diagnosis not present

## 2015-04-14 DIAGNOSIS — I10 Essential (primary) hypertension: Secondary | ICD-10-CM | POA: Diagnosis not present

## 2015-04-14 DIAGNOSIS — R2689 Other abnormalities of gait and mobility: Secondary | ICD-10-CM | POA: Diagnosis not present

## 2015-04-14 DIAGNOSIS — E119 Type 2 diabetes mellitus without complications: Secondary | ICD-10-CM | POA: Diagnosis not present

## 2015-04-21 DIAGNOSIS — I10 Essential (primary) hypertension: Secondary | ICD-10-CM | POA: Diagnosis not present

## 2015-04-21 DIAGNOSIS — N390 Urinary tract infection, site not specified: Secondary | ICD-10-CM | POA: Diagnosis not present

## 2015-04-21 DIAGNOSIS — Z23 Encounter for immunization: Secondary | ICD-10-CM | POA: Diagnosis not present

## 2015-04-21 DIAGNOSIS — R609 Edema, unspecified: Secondary | ICD-10-CM | POA: Diagnosis not present

## 2015-04-21 DIAGNOSIS — F0150 Vascular dementia without behavioral disturbance: Secondary | ICD-10-CM | POA: Diagnosis not present

## 2015-04-21 DIAGNOSIS — R32 Unspecified urinary incontinence: Secondary | ICD-10-CM | POA: Diagnosis not present

## 2015-04-21 DIAGNOSIS — E039 Hypothyroidism, unspecified: Secondary | ICD-10-CM | POA: Diagnosis not present

## 2015-04-21 DIAGNOSIS — E119 Type 2 diabetes mellitus without complications: Secondary | ICD-10-CM | POA: Diagnosis not present

## 2015-04-21 DIAGNOSIS — E785 Hyperlipidemia, unspecified: Secondary | ICD-10-CM | POA: Diagnosis not present

## 2015-04-21 DIAGNOSIS — I2510 Atherosclerotic heart disease of native coronary artery without angina pectoris: Secondary | ICD-10-CM | POA: Diagnosis not present

## 2015-04-21 DIAGNOSIS — Z1389 Encounter for screening for other disorder: Secondary | ICD-10-CM | POA: Diagnosis not present

## 2015-05-21 ENCOUNTER — Inpatient Hospital Stay (HOSPITAL_COMMUNITY): Payer: Medicare Other

## 2015-05-21 ENCOUNTER — Encounter (HOSPITAL_COMMUNITY): Payer: Self-pay | Admitting: *Deleted

## 2015-05-21 ENCOUNTER — Inpatient Hospital Stay (HOSPITAL_COMMUNITY)
Admission: EM | Admit: 2015-05-21 | Discharge: 2015-05-26 | DRG: 872 | Disposition: A | Discharge status: Home / Self Care | Payer: Medicare Other | Attending: Internal Medicine | Admitting: Internal Medicine

## 2015-05-21 ENCOUNTER — Emergency Department (HOSPITAL_COMMUNITY): Payer: Medicare Other

## 2015-05-21 DIAGNOSIS — I10 Essential (primary) hypertension: Secondary | ICD-10-CM | POA: Diagnosis not present

## 2015-05-21 DIAGNOSIS — E119 Type 2 diabetes mellitus without complications: Secondary | ICD-10-CM | POA: Diagnosis not present

## 2015-05-21 DIAGNOSIS — A419 Sepsis, unspecified organism: Principal | ICD-10-CM | POA: Diagnosis present

## 2015-05-21 DIAGNOSIS — N390 Urinary tract infection, site not specified: Secondary | ICD-10-CM | POA: Diagnosis not present

## 2015-05-21 DIAGNOSIS — I639 Cerebral infarction, unspecified: Secondary | ICD-10-CM | POA: Diagnosis not present

## 2015-05-21 DIAGNOSIS — N179 Acute kidney failure, unspecified: Secondary | ICD-10-CM | POA: Diagnosis not present

## 2015-05-21 DIAGNOSIS — I2510 Atherosclerotic heart disease of native coronary artery without angina pectoris: Secondary | ICD-10-CM | POA: Diagnosis present

## 2015-05-21 DIAGNOSIS — R269 Unspecified abnormalities of gait and mobility: Secondary | ICD-10-CM | POA: Diagnosis present

## 2015-05-21 DIAGNOSIS — R609 Edema, unspecified: Secondary | ICD-10-CM | POA: Diagnosis not present

## 2015-05-21 DIAGNOSIS — N323 Diverticulum of bladder: Secondary | ICD-10-CM | POA: Diagnosis present

## 2015-05-21 DIAGNOSIS — E860 Dehydration: Secondary | ICD-10-CM | POA: Diagnosis present

## 2015-05-21 DIAGNOSIS — Z8673 Personal history of transient ischemic attack (TIA), and cerebral infarction without residual deficits: Secondary | ICD-10-CM | POA: Diagnosis not present

## 2015-05-21 DIAGNOSIS — E785 Hyperlipidemia, unspecified: Secondary | ICD-10-CM | POA: Diagnosis present

## 2015-05-21 DIAGNOSIS — F0150 Vascular dementia without behavioral disturbance: Secondary | ICD-10-CM | POA: Diagnosis present

## 2015-05-21 DIAGNOSIS — E039 Hypothyroidism, unspecified: Secondary | ICD-10-CM

## 2015-05-21 DIAGNOSIS — Z955 Presence of coronary angioplasty implant and graft: Secondary | ICD-10-CM | POA: Diagnosis not present

## 2015-05-21 DIAGNOSIS — I517 Cardiomegaly: Secondary | ICD-10-CM | POA: Diagnosis present

## 2015-05-21 DIAGNOSIS — R0600 Dyspnea, unspecified: Secondary | ICD-10-CM | POA: Diagnosis not present

## 2015-05-21 DIAGNOSIS — Z87440 Personal history of urinary (tract) infections: Secondary | ICD-10-CM | POA: Diagnosis not present

## 2015-05-21 DIAGNOSIS — Z6830 Body mass index (BMI) 30.0-30.9, adult: Secondary | ICD-10-CM | POA: Diagnosis not present

## 2015-05-21 DIAGNOSIS — N400 Enlarged prostate without lower urinary tract symptoms: Secondary | ICD-10-CM | POA: Diagnosis present

## 2015-05-21 DIAGNOSIS — R531 Weakness: Secondary | ICD-10-CM

## 2015-05-21 DIAGNOSIS — R0602 Shortness of breath: Secondary | ICD-10-CM | POA: Diagnosis not present

## 2015-05-21 LAB — URINALYSIS, ROUTINE W REFLEX MICROSCOPIC
Bilirubin Urine: NEGATIVE
Glucose, UA: NEGATIVE mg/dL
Ketones, ur: NEGATIVE mg/dL
Nitrite: POSITIVE — AB
Protein, ur: 100 mg/dL — AB
Specific Gravity, Urine: 1.016 (ref 1.005–1.030)
UROBILINOGEN UA: 1 mg/dL (ref 0.0–1.0)
pH: 7 (ref 5.0–8.0)

## 2015-05-21 LAB — CBC WITH DIFFERENTIAL/PLATELET
BASOS PCT: 0 %
Basophils Absolute: 0 10*3/uL (ref 0.0–0.1)
Eosinophils Absolute: 0 10*3/uL (ref 0.0–0.7)
Eosinophils Relative: 0 %
HCT: 43.8 % (ref 39.0–52.0)
HEMOGLOBIN: 15.1 g/dL (ref 13.0–17.0)
Lymphocytes Relative: 5 %
Lymphs Abs: 1.1 10*3/uL (ref 0.7–4.0)
MCH: 30.5 pg (ref 26.0–34.0)
MCHC: 34.5 g/dL (ref 30.0–36.0)
MCV: 88.5 fL (ref 78.0–100.0)
Monocytes Absolute: 2.3 10*3/uL — ABNORMAL HIGH (ref 0.1–1.0)
Monocytes Relative: 11 %
NEUTROS ABS: 18.2 10*3/uL — AB (ref 1.7–7.7)
NEUTROS PCT: 84 %
Platelets: 282 10*3/uL (ref 150–400)
RBC: 4.95 MIL/uL (ref 4.22–5.81)
RDW: 13.4 % (ref 11.5–15.5)
WBC: 21.6 10*3/uL — ABNORMAL HIGH (ref 4.0–10.5)

## 2015-05-21 LAB — COMPREHENSIVE METABOLIC PANEL
ALK PHOS: 70 U/L (ref 38–126)
ALT: 18 U/L (ref 17–63)
ANION GAP: 8 (ref 5–15)
AST: 36 U/L (ref 15–41)
Albumin: 3.8 g/dL (ref 3.5–5.0)
BUN: 21 mg/dL — ABNORMAL HIGH (ref 6–20)
CALCIUM: 9.1 mg/dL (ref 8.9–10.3)
CO2: 24 mmol/L (ref 22–32)
Chloride: 107 mmol/L (ref 101–111)
Creatinine, Ser: 1.47 mg/dL — ABNORMAL HIGH (ref 0.61–1.24)
GFR calc non Af Amer: 45 mL/min — ABNORMAL LOW (ref >60)
GFR, EST AFRICAN AMERICAN: 52 mL/min — AB (ref >60)
Glucose, Bld: 116 mg/dL — ABNORMAL HIGH (ref 65–99)
Potassium: 4.2 mmol/L (ref 3.5–5.1)
SODIUM: 139 mmol/L (ref 135–145)
Total Bilirubin: 0.8 mg/dL (ref 0.3–1.2)
Total Protein: 7.2 g/dL (ref 6.5–8.1)

## 2015-05-21 LAB — URINE MICROSCOPIC-ADD ON

## 2015-05-21 LAB — I-STAT CG4 LACTIC ACID, ED: Lactic Acid, Venous: 1.21 mmol/L (ref 0.5–2.0)

## 2015-05-21 LAB — PROTIME-INR
INR: 1.3 (ref 0.00–1.49)
Prothrombin Time: 16.3 seconds — ABNORMAL HIGH (ref 11.6–15.2)

## 2015-05-21 LAB — LACTIC ACID, PLASMA: LACTIC ACID, VENOUS: 1.2 mmol/L (ref 0.5–2.0)

## 2015-05-21 LAB — I-STAT TROPONIN, ED: Troponin i, poc: 0.01 ng/mL (ref 0.00–0.08)

## 2015-05-21 LAB — GLUCOSE, CAPILLARY: Glucose-Capillary: 136 mg/dL — ABNORMAL HIGH (ref 65–99)

## 2015-05-21 LAB — PROCALCITONIN: PROCALCITONIN: 0.11 ng/mL

## 2015-05-21 LAB — APTT: APTT: 33 s (ref 24–37)

## 2015-05-21 MED ORDER — ALUM & MAG HYDROXIDE-SIMETH 200-200-20 MG/5ML PO SUSP: 30.0000 mL | Freq: Four times a day (QID) | ORAL | Status: DC | PRN

## 2015-05-21 MED ORDER — ACETAMINOPHEN 325 MG PO TABS: 650.0000 mg | ORAL_TABLET | Freq: Four times a day (QID) | ORAL | Status: DC | PRN

## 2015-05-21 MED ORDER — ACETAMINOPHEN 500 MG PO TABS
1000.0000 mg | ORAL_TABLET | Freq: Once | ORAL | Status: AC
  Administered 2015-05-21: 1000 mg via ORAL
  Filled 2015-05-21: qty 2

## 2015-05-21 MED ORDER — ONDANSETRON HCL 4 MG/2ML IJ SOLN: 4.0000 mg | Freq: Three times a day (TID) | INTRAMUSCULAR | Status: DC | PRN

## 2015-05-21 MED ORDER — LEVOFLOXACIN IN D5W 750 MG/150ML IV SOLN
750.0000 mg | Freq: Once | INTRAVENOUS | Status: AC
  Administered 2015-05-21: 750 mg via INTRAVENOUS
  Filled 2015-05-21: qty 150

## 2015-05-21 MED ORDER — AMLODIPINE BESYLATE 10 MG PO TABS: 10.0000 mg | ORAL_TABLET | Freq: Every day | ORAL | Status: DC

## 2015-05-21 MED ORDER — DONEPEZIL HCL 5 MG PO TABS
5.0000 mg | ORAL_TABLET | Freq: Every day | ORAL | Status: DC
  Administered 2015-05-21 – 2015-05-25 (×5): 5 mg via ORAL
  Filled 2015-05-21 (×5): qty 1

## 2015-05-21 MED ORDER — SODIUM CHLORIDE 0.9 % IV SOLN: INTRAVENOUS | Status: DC

## 2015-05-21 MED ORDER — CLOPIDOGREL BISULFATE 75 MG PO TABS
75.0000 mg | ORAL_TABLET | Freq: Every evening | ORAL | Status: DC
  Administered 2015-05-21 – 2015-05-25 (×5): 75 mg via ORAL
  Filled 2015-05-21 (×5): qty 1

## 2015-05-21 MED ORDER — SODIUM CHLORIDE 0.9 % IJ SOLN
3.0000 mL | Freq: Two times a day (BID) | INTRAMUSCULAR | Status: DC
  Administered 2015-05-21 – 2015-05-26 (×9): 3 mL via INTRAVENOUS

## 2015-05-21 MED ORDER — HYDRALAZINE HCL 20 MG/ML IJ SOLN: 5.0000 mg | INTRAMUSCULAR | Status: DC | PRN

## 2015-05-21 MED ORDER — AMLODIPINE BESYLATE 10 MG PO TABS
10.0000 mg | ORAL_TABLET | Freq: Every day | ORAL | Status: DC
  Administered 2015-05-22 – 2015-05-23 (×2): 10 mg via ORAL
  Filled 2015-05-21 (×2): qty 1

## 2015-05-21 MED ORDER — CARVEDILOL 12.5 MG PO TABS
12.5000 mg | ORAL_TABLET | Freq: Two times a day (BID) | ORAL | Status: DC
  Administered 2015-05-21 – 2015-05-26 (×10): 12.5 mg via ORAL
  Filled 2015-05-21 (×10): qty 1

## 2015-05-21 MED ORDER — LEVOTHYROXINE SODIUM 25 MCG PO TABS
25.0000 ug | ORAL_TABLET | Freq: Every day | ORAL | Status: DC
  Administered 2015-05-22 – 2015-05-26 (×5): 25 ug via ORAL
  Filled 2015-05-21 (×5): qty 1

## 2015-05-21 MED ORDER — HEPARIN SODIUM (PORCINE) 5000 UNIT/ML IJ SOLN
5000.0000 [IU] | Freq: Three times a day (TID) | INTRAMUSCULAR | Status: DC
  Administered 2015-05-21 – 2015-05-26 (×14): 5000 [IU] via SUBCUTANEOUS
  Filled 2015-05-21 (×4): qty 1

## 2015-05-21 MED ORDER — PRAVASTATIN SODIUM 80 MG PO TABS
80.0000 mg | ORAL_TABLET | Freq: Every day | ORAL | Status: DC
  Administered 2015-05-21 – 2015-05-25 (×5): 80 mg via ORAL
  Filled 2015-05-21 (×7): qty 1

## 2015-05-21 MED ORDER — DEXTROSE 5 % IV SOLN
1.0000 g | INTRAVENOUS | Status: DC
  Administered 2015-05-21 – 2015-05-25 (×5): 1 g via INTRAVENOUS
  Filled 2015-05-21 (×6): qty 10

## 2015-05-21 MED ORDER — SODIUM CHLORIDE 0.9 % IV BOLUS (SEPSIS)
1000.0000 mL | Freq: Once | INTRAVENOUS | Status: AC
Start: 2015-05-21 — End: 2015-05-21
  Administered 2015-05-21: 1000 mL via INTRAVENOUS

## 2015-05-21 MED ORDER — INSULIN ASPART 100 UNIT/ML ~~LOC~~ SOLN
0.0000 [IU] | Freq: Three times a day (TID) | SUBCUTANEOUS | Status: DC
  Administered 2015-05-22 – 2015-05-23 (×2): 2 [IU] via SUBCUTANEOUS
  Administered 2015-05-23 – 2015-05-24 (×2): 5 [IU] via SUBCUTANEOUS
  Administered 2015-05-24 – 2015-05-26 (×3): 1 [IU] via SUBCUTANEOUS

## 2015-05-21 MED ORDER — SODIUM CHLORIDE 0.9 % IV SOLN
INTRAVENOUS | Status: DC
  Administered 2015-05-21: 22:00:00 via INTRAVENOUS

## 2015-05-21 NOTE — ED Provider Notes (Signed)
CSN: [id number]     Arrival date & time 05/21/15  1700 History   First MD Initiated Contact with Patient 05/21/15 1723     Chief Complaint  Patient presents with  . Weakness  . Shortness of Breath     (Consider location/radiation/quality/duration/timing/severity/associated sxs/prior Treatment) Patient is a 74 y.o. male presenting with weakness and shortness of breath.  Weakness This is a new problem. Episode onset: 10 hours ago. The problem occurs constantly. The problem has not changed since onset.Associated symptoms include shortness of breath. Nothing aggravates the symptoms. Nothing relieves the symptoms.  Shortness of Breath   Past Medical History  Diagnosis Date  . Coronary artery disease   . Hypertension   . Hyperlipidemia   . Type 2 diabetes mellitus   . Memory loss   . Stroke   . Hypothyroidism   . Hyperlipemia   . Peripheral edema   . Mental status change   . CAD (coronary artery disease)    Past Surgical History  Procedure Laterality Date  . Coronary stent placement     Family History  Problem Relation Age of Onset  . Stroke Mother   . Aneurysm Father    Social History  Substance Use Topics  . Smoking status: Former Smoker    Quit date: 08/31/1963  . Smokeless tobacco: None  . Alcohol Use: No    Review of Systems  Respiratory: Positive for shortness of breath.   Neurological: Positive for weakness.  All other systems reviewed and are negative.     Allergies  Review of patient's allergies indicates no known allergies.  Home Medications   Prior to Admission medications   Medication Sig Start Date End Date Taking? Authorizing Provider  amLODipine (NORVASC) 10 MG tablet Take 1 tablet by mouth daily. 12/17/14  Yes Historical Provider, MD  carvedilol (COREG) 12.5 MG tablet Take 1 tablet by mouth 2 (two) times daily with a meal.  12/17/14  Yes Historical Provider, MD  clopidogrel (PLAVIX) 75 MG tablet Take 1 tablet by mouth every evening.  11/30/14   Yes Historical Provider, MD  donepezil (ARICEPT) 5 MG tablet Take 1 tablet (5 mg total) by mouth at bedtime. 01/29/15  Yes Huston Foley, MD  enalapril (VASOTEC) 20 MG tablet Take 1 tablet by mouth daily. 12/17/14  Yes Historical Provider, MD  furosemide (LASIX) 20 MG tablet Take 1 tablet by mouth daily.  12/17/14  Yes Historical Provider, MD  glimepiride (AMARYL) 2 MG tablet Take 1 tablet by mouth daily. 12/17/14  Yes Historical Provider, MD  levothyroxine (SYNTHROID, LEVOTHROID) 25 MCG tablet Take 1 tablet by mouth daily. 11/30/14  Yes Historical Provider, MD  potassium chloride (MICRO-K) 10 MEQ CR capsule Take 1 capsule by mouth daily. 12/17/14  Yes Historical Provider, MD  pravastatin (PRAVACHOL) 80 MG tablet Take 80 mg by mouth at bedtime.  12/17/14  Yes Historical Provider, MD   BP 154/92 mmHg  Pulse 83  Temp(Src) 102.3 F (39.1 C) (Rectal)  Resp 26  SpO2 94% Physical Exam  Constitutional: He is oriented to person, place, and time. He appears well-developed and well-nourished.  HENT:  Head: Normocephalic and atraumatic.  Eyes: Conjunctivae and EOM are normal.  Neck: Normal range of motion. Neck supple.  Cardiovascular: Normal rate, regular rhythm and normal heart sounds.   Pulmonary/Chest: Effort normal and breath sounds normal. No respiratory distress.  Abdominal: He exhibits no distension. There is no tenderness. There is no rebound and no guarding.  Musculoskeletal: Normal range of motion.  Neurological: He is alert and oriented to person, place, and time.  Skin: Skin is warm and dry.  Vitals reviewed.   ED Course  Procedures (including critical care time) Labs Review Labs Reviewed  COMPREHENSIVE METABOLIC PANEL - Abnormal; Notable for the following:    Glucose, Bld 116 (*)    BUN 21 (*)    Creatinine, Ser 1.47 (*)    GFR calc non Af Amer 45 (*)    GFR calc Af Amer 52 (*)    All other components within normal limits  CBC WITH DIFFERENTIAL/PLATELET - Abnormal; Notable for the  following:    WBC 21.6 (*)    Neutro Abs 18.2 (*)    Monocytes Absolute 2.3 (*)    All other components within normal limits  URINALYSIS, ROUTINE W REFLEX MICROSCOPIC (NOT AT [facility]) - Abnormal; Notable for the following:    APPearance CLOUDY (*)    Hgb urine dipstick MODERATE (*)    Protein, ur 100 (*)    Nitrite POSITIVE (*)    Leukocytes, UA LARGE (*)    All other components within normal limits  URINE MICROSCOPIC-ADD ON - Abnormal; Notable for the following:    Squamous Epithelial / LPF FEW (*)    Bacteria, UA MANY (*)    All other components within normal limits  CULTURE, BLOOD (ROUTINE X 2)  CULTURE, BLOOD (ROUTINE X 2)  URINE CULTURE  I-STAT CG4 LACTIC ACID, ED  Rosezena Sensor, ED    Imaging Review Dg Chest 2 View  05/21/2015   CLINICAL DATA:  Shortness of breath, weakness  EXAM: CHEST  2 VIEW  COMPARISON:  No similar prior exam is available at this institution for comparison or on [employer].  FINDINGS: Mild enlargement of the cardiomediastinal silhouette is noted. The aorta is unfolded and ectatic. Presumed prominence of the vascular pedicle noted over the right paratracheal region. Lungs are hypoaerated with crowding of the bronchovascular markings. No pleural effusion. No acute osseous abnormality.  IMPRESSION: Cardiomegaly with low lung volumes and crowding of the bronchovascular markings.  Prominence of the superior vascular pedicle.   Electronically Signed   By: Christiana Pellant M.D.   On: 05/21/2015 18:40   I have personally reviewed and evaluated these images and lab results as part of my medical decision-making.   EKG Interpretation   Date/Time:  Wednesday May 21 2015 17:07:40 EDT Ventricular Rate:  87 PR Interval:  160 QRS Duration: 98 QT Interval:  372 QTC Calculation: 447 R Axis:   60 Text Interpretation:  Normal sinus rhythm Normal ECG No old tracing to  compare Confirmed by Mirian Mo [phone]) on 05/21/2015 5:23:46 PM      MDM   Final  diagnoses:  Essential hypertension  Stroke  Hypothyroidism, unspecified hypothyroidism type    74 y.o. male with pertinent PMH of CAD, HTN, HLD, DM presents with malaise, generalized weakness in setting of completion of keflex course for UTI yesterday.  On arrival vitals and physical exam as above.  Wu with UTI, leukocytosis.  Likely source of symptoms, failed abx course.  Admitted with levaquin for refractory UTI.    I have reviewed all laboratory and imaging studies if ordered as above  1. Essential hypertension   2. Stroke   3. Hypothyroidism, unspecified hypothyroidism type         Mirian Mo, MD 05/21/15 2440

## 2015-05-21 NOTE — ED Notes (Signed)
Pt placed in yellow "high fall risk" socks and a high fall risk bracelet

## 2015-05-21 NOTE — H&P (Addendum)
[facility] History and Physical  Leroy Rodriguez ZOX:[id number] DOB: 25-Dec-1940 DOA: 05/21/2015  Referring physician: ED physician PCP: Sissy Hoff, MD  Specialists:   Chief Complaint: Generalized weakness  HPI: Leroy Rodriguez is a 74 y.o. male with PMH of hypertension, hyperlipidemia, diabetes mellitus, hypothyroidism, vascular dementia, stroke, CAD, s/p of stent, who presents with generalized weakness.  The patient's daughter, patient has been using walker to walk around at home after he had a stroke. Today he becomes weaker and has a decreased oral intake since this AM. He feels so weak that he could not walk around using his walker anymore. He does not have new unilateral weakness, numbness or tingling sensations per his daughter. He has fever. He denies symptoms of UTI. No abdominal pain and diarrhea. He has very mild dry cough and very mild SOB which have resolved when he laying down in the bed. No chest pain or shortness breast. Daughter reports that he was treated for UTI 3 weeks ago. After he completed keflex treatment, his bad urine smell has disappeared 3 weeks ago.  In ED, patient was found to have positive urinalysis for UTI, negative troponin, lactate 1.21, temperature 102.3, no tachycardia, WBC 21.6, Cre 1.47 (no previous creatinine on record). CXR showed cardiomegaly with low lung volumes, crowding of the bronchovascular markings and prominence of the superior vascular pedicle. Patient is admitted to inpatient for further evaluation and treatment.  Where does patient live?   At home    Can patient participate in ADLs?  Barely   Review of Systems:   General: has fevers, chills, no changes in body weight, has poor appetite, has fatigue HEENT: no blurry vision, hearing changes or sore throat Pulm: had mild dyspnea, coughing, no wheezing CV: no chest pain, palpitations Abd: no nausea, vomiting, abdominal pain, diarrhea, constipation GU: no dysuria, burning on urination,  increased urinary frequency, hematuria  Ext: has leg edema Neuro: no unilateral weakness, numbness, or tingling, no vision change or hearing loss Skin: no rash MSK: No muscle spasm, no deformity, no limitation of range of movement in spin Heme: No easy bruising.  Travel history: No recent long distant travel.  Allergy: No Known Allergies  Past Medical History  Diagnosis Date  . Coronary artery disease   . Hypertension   . Hyperlipidemia   . Type 2 diabetes mellitus   . Memory loss   . Stroke   . Hypothyroidism   . Hyperlipemia   . Peripheral edema   . Mental status change   . CAD (coronary artery disease)     Past Surgical History  Procedure Laterality Date  . Coronary stent placement      Social History:  reports that he quit smoking about 51 years ago. He does not have any smokeless tobacco history on file. He reports that he does not drink alcohol or use illicit drugs.  Family History:  Family History  Problem Relation Age of Onset  . Stroke Mother   . Aneurysm Father      Prior to Admission medications   Medication Sig Start Date End Date Taking? Authorizing Provider  amLODipine (NORVASC) 10 MG tablet Take 1 tablet by mouth daily. 12/17/14  Yes Historical Provider, MD  carvedilol (COREG) 12.5 MG tablet Take 1 tablet by mouth 2 (two) times daily with a meal.  12/17/14  Yes Historical Provider, MD  clopidogrel (PLAVIX) 75 MG tablet Take 1 tablet by mouth every evening.  11/30/14  Yes Historical Provider, MD  donepezil (ARICEPT) 5 MG tablet  Take 1 tablet (5 mg total) by mouth at bedtime. 01/29/15  Yes Huston Foley, MD  enalapril (VASOTEC) 20 MG tablet Take 1 tablet by mouth daily. 12/17/14  Yes Historical Provider, MD  furosemide (LASIX) 20 MG tablet Take 1 tablet by mouth daily.  12/17/14  Yes Historical Provider, MD  glimepiride (AMARYL) 2 MG tablet Take 1 tablet by mouth daily. 12/17/14  Yes Historical Provider, MD  levothyroxine (SYNTHROID, LEVOTHROID) 25 MCG tablet Take 1  tablet by mouth daily. 11/30/14  Yes Historical Provider, MD  potassium chloride (MICRO-K) 10 MEQ CR capsule Take 1 capsule by mouth daily. 12/17/14  Yes Historical Provider, MD  pravastatin (PRAVACHOL) 80 MG tablet Take 80 mg by mouth at bedtime.  12/17/14  Yes Historical Provider, MD    Physical Exam: Filed Vitals:   05/21/15 1714 05/21/15 1800 05/21/15 1815 05/21/15 1841  BP: 139/72 137/73 154/92   Pulse: 86  83   Temp: 100.4 F (38 C)   102.3 F (39.1 C)  TempSrc: Oral   Rectal  Resp: SpO2: 95%  94%    General: Not in acute distress HEENT:       Eyes: PERRL, EOMI, no scleral icterus.       ENT: No discharge from the ears and nose, no pharynx injection, no tonsillar enlargement.        Neck: No JVD, no bruit, no mass felt. Heme: No neck lymph node enlargement. Cardiac: S1/S2, RRR, No murmurs, No gallops or rubs. Pulm: No rales, wheezing, rhonchi or rubs. Abd: Soft, nondistended, nontender, no rebound pain, no organomegaly, BS present. Ext: 1+ pitting leg edema bilaterally. 2+DP/PT pulse bilaterally. Musculoskeletal: No joint deformities, No joint redness or warmth, no limitation of ROM in spin. Skin: No rashes.  Neuro: Alert, oriented X3, cranial nerves II-XII grossly intact, muscle strength 5/5 in all extremities, sensation to light touch intact. Brachial reflex 1+ bilaterally. Knee reflex 1+ bilaterally. Negative Babinski's sign. Normal finger to nose test. Psych: Patient is not psychotic, no suicidal or hemocidal ideation.  Labs on Admission:  Basic Metabolic Panel:  Recent Labs Lab 05/21/15 1750  NA 139  K 4.2  CL 107  CO2 24  GLUCOSE 116*  BUN 21*  CREATININE 1.47*  CALCIUM 9.1   Liver Function Tests:  Recent Labs Lab 05/21/15 1750  AST 36  ALT 18  ALKPHOS 70  BILITOT 0.8  PROT 7.2  ALBUMIN 3.8   No results for input(s): LIPASE, AMYLASE in the last 168 hours. No results for input(s): AMMONIA in the last 168 hours. CBC:  Recent Labs Lab  05/21/15 1750  WBC 21.6*  NEUTROABS 18.2*  HGB 15.1  HCT 43.8  MCV 88.5  PLT 282   Cardiac Enzymes: No results for input(s): CKTOTAL, CKMB, CKMBINDEX, TROPONINI in the last 168 hours.  BNP (last 3 results) No results for input(s): BNP in the last 8760 hours.  ProBNP (last 3 results) No results for input(s): PROBNP in the last 8760 hours.  CBG: No results for input(s): GLUCAP in the last 168 hours.  Radiological Exams on Admission: Dg Chest 2 View  05/21/2015   CLINICAL DATA:  Shortness of breath, weakness  EXAM: CHEST  2 VIEW  COMPARISON:  No similar prior exam is available at this institution for comparison or on [facility] PACS.  FINDINGS: Mild enlargement of the cardiomediastinal silhouette is noted. The aorta is unfolded and ectatic. Presumed prominence of the vascular pedicle noted over the right paratracheal region. Lungs  are hypoaerated with crowding of the bronchovascular markings. No pleural effusion. No acute osseous abnormality.  IMPRESSION: Cardiomegaly with low lung volumes and crowding of the bronchovascular markings.  Prominence of the superior vascular pedicle.   Electronically Signed   By: Christiana Pellant M.D.   On: 05/21/2015 18:40    EKG: Independently reviewed. QTC 447, T-wave flattening in V4-V6, T-wave inversion only in lead 3. Assessment/Plan Principal Problem:   UTI (lower urinary tract infection) Active Problems:   Coronary artery disease   Essential hypertension   Hyperlipidemia   Hypertension   Stroke   Hypothyroidism   Diabetes mellitus without complication   Sepsis   Generalized weakness   AKI (acute kidney injury)   Vascular dementia   UTI and sepsis 2/2 UTI: Patient is septic with leukocytosis and tachycardia, which is most likely due to UTI given positive urinalysis. Though patient does not have typical symptoms of UTI, he has generalized weakness, which consistent with UTI. Lactate is normal. Hemodynamically stable.   - Admit to telemetry -  ED started levaquine, will switch Ceftriaxone IV - Follow up results of urine and blood cx and amend antibiotic regimen if needed per sensitivity results - prn Zofran for nausea - will get Procalcitonin and trend lactic acid levels per sepsis protocol. - IVF: 1L of NS bolus in ED, followed by 75 cc/h  Generalize weakness: Most likely due to UTI and sepsis. Per patient's daughter, patient has generalized weakness, but no unilateral weakness, numbness or tingling sensations to indicate new stroke. -Treated UTI and sepsis as above -pt/ot -observe pt closely while treating his UTI and sepsis. IF has poor balance or dizziness after infection is controlled, may consider MRI to rule out stroke.  Coronary artery disease: s/p of stent. No chest pain. -continue ASA, plavix and coreg  AKI: Cre=1.47 and BUN 21, no previous creatinine on record. Likely due to prerenal secondary to dehydration and continuation of ACEI and diruetics. - IVF as above - Check FeUrea - US-renal - Follow up renal function by BMP - Hold lasix and enalapril  HTN: Patient does not carry diagnosis of CHF, not to [facility] records. He has bilateral 1+ leg edema, will need to r/o CHF. -Continue amlodipine and Coreg -Hold her Lasix and enalapril due to AKI -IV hydralazine when necessary -check BNP. If elevated, my check 2d echo  HLD: Last LDL was not on record -Continue home medications: Pravastatin -Check FLP  Stroke: No signs of a new stroke. -ASA and plavix  Hypothyroidism: Last TSH was not on record -Continue home Synthroid -Check TSH  DM-II: Last A1c was not on record. Patient is taking Amaryl at home -SSI -Check A1c  Vascular dementia: -Continue donepezil   DVT ppx: SQ Heparin       Code Status: Full code Family Communication:    Yes, patient's daughter and son at bed side Disposition Plan: Admit to inpatient   Date of Service 05/21/2015    Lorretta Harp [facility] Pager [phone]  If  7PM-7AM, please contact night-coverage [email or web address] Password [facility] 05/21/2015, 8:33 PM

## 2015-05-21 NOTE — ED Notes (Signed)
Pt taken to US

## 2015-05-21 NOTE — Progress Notes (Signed)
Attempted report. Waiting on call back.  Mahmoud,Zenab I, RN 

## 2015-05-21 NOTE — ED Notes (Signed)
Family reports that pt became weak in bilateral legs this morning at 0730. States that he has had decreased appetite and SOB since. Pt denies any complaints or pain in triage. Pt has hx of vascular dementia.

## 2015-05-22 ENCOUNTER — Inpatient Hospital Stay (HOSPITAL_COMMUNITY): Payer: Medicare Other

## 2015-05-22 DIAGNOSIS — R0602 Shortness of breath: Secondary | ICD-10-CM

## 2015-05-22 DIAGNOSIS — R609 Edema, unspecified: Secondary | ICD-10-CM

## 2015-05-22 DIAGNOSIS — N390 Urinary tract infection, site not specified: Secondary | ICD-10-CM

## 2015-05-22 DIAGNOSIS — I10 Essential (primary) hypertension: Secondary | ICD-10-CM

## 2015-05-22 DIAGNOSIS — E119 Type 2 diabetes mellitus without complications: Secondary | ICD-10-CM

## 2015-05-22 DIAGNOSIS — A419 Sepsis, unspecified organism: Principal | ICD-10-CM

## 2015-05-22 DIAGNOSIS — R531 Weakness: Secondary | ICD-10-CM

## 2015-05-22 DIAGNOSIS — F0150 Vascular dementia without behavioral disturbance: Secondary | ICD-10-CM

## 2015-05-22 DIAGNOSIS — N179 Acute kidney failure, unspecified: Secondary | ICD-10-CM

## 2015-05-22 LAB — CBC
HCT: 38.3 % — ABNORMAL LOW (ref 39.0–52.0)
HEMOGLOBIN: 12.7 g/dL — AB (ref 13.0–17.0)
MCH: 29.1 pg (ref 26.0–34.0)
MCHC: 33.2 g/dL (ref 30.0–36.0)
MCV: 87.8 fL (ref 78.0–100.0)
PLATELETS: 248 10*3/uL (ref 150–400)
RBC: 4.36 MIL/uL (ref 4.22–5.81)
RDW: 13.3 % (ref 11.5–15.5)
WBC: 20.7 10*3/uL — ABNORMAL HIGH (ref 4.0–10.5)

## 2015-05-22 LAB — BASIC METABOLIC PANEL
ANION GAP: 6 (ref 5–15)
BUN: 20 mg/dL (ref 6–20)
CALCIUM: 8.5 mg/dL — AB (ref 8.9–10.3)
CO2: 23 mmol/L (ref 22–32)
Chloride: 110 mmol/L (ref 101–111)
Creatinine, Ser: 1.23 mg/dL (ref 0.61–1.24)
GFR, EST NON AFRICAN AMERICAN: 55 mL/min — AB (ref >60)
Glucose, Bld: 112 mg/dL — ABNORMAL HIGH (ref 65–99)
Potassium: 3.7 mmol/L (ref 3.5–5.1)
SODIUM: 139 mmol/L (ref 135–145)

## 2015-05-22 LAB — LIPID PANEL
CHOL/HDL RATIO: 2.7 ratio
CHOLESTEROL: 100 mg/dL (ref 0–200)
HDL: 37 mg/dL — ABNORMAL LOW (ref >40)
LDL Cholesterol: 52 mg/dL (ref 0–99)
TRIGLYCERIDES: 56 mg/dL (ref <150)
VLDL: 11 mg/dL (ref 0–40)

## 2015-05-22 LAB — TSH: TSH: 2.035 u[IU]/mL (ref 0.350–4.500)

## 2015-05-22 LAB — GLUCOSE, CAPILLARY
GLUCOSE-CAPILLARY: 177 mg/dL — AB (ref 65–99)
Glucose-Capillary: 118 mg/dL — ABNORMAL HIGH (ref 65–99)
Glucose-Capillary: 131 mg/dL — ABNORMAL HIGH (ref 65–99)

## 2015-05-22 LAB — BRAIN NATRIURETIC PEPTIDE: B NATRIURETIC PEPTIDE 5: 76.6 pg/mL (ref 0.0–100.0)

## 2015-05-22 LAB — CREATININE, URINE, RANDOM: Creatinine, Urine: 62.54 mg/dL

## 2015-05-22 NOTE — Evaluation (Signed)
Occupational Therapy Evaluation Patient Details Name: Leroy Rodriguez MRN: [id number] DOB: [birth year] Today's Date: 05/22/2015    History of Present Illness Pt admitted with generalized weakness and difficulty ambulating as a result of UTI.  PMH: HTN, HLD, DM, vascular dementia, stroke, CAD with stent.   Clinical Impression   Pt was able to shower in standing, dress and toilet with distant supervision prior to admission. He ambulated with a RW with no hx of falls per son. Pt presents with generalized weakness, impaired balance and decreased cognition interfering with ability to perform self care and ADL transfers.  Pt's son is supportive and provides 24 hour care.  Pt is unlikely to be amenable to DME for toileting or showering per son.  Will follow acutely.    Follow Up Recommendations  No OT follow up;Supervision/Assistance - 24 hour    Equipment Recommendations  None recommended by OT    Recommendations for Other Services       Precautions / Restrictions Precautions Precautions: Fall Restrictions Weight Bearing Restrictions: No      Mobility Bed Mobility Overal bed mobility: Needs Assistance Bed Mobility: Supine to Sit;Sit to Supine     Supine to sit: Supervision Sit to supine: Supervision   General bed mobility comments: supervision for safety and to manage lines  Transfers Overall transfer level: Needs assistance   Transfers: Sit to/from Stand Sit to Stand: Min assist              Balance Overall balance assessment: Needs assistance   Sitting balance-Leahy Scale: Fair       Standing balance-Leahy Scale: Poor                              ADL Overall ADL's : Needs assistance/impaired Eating/Feeding: Set up;Sitting   Grooming: Wash/dry hands;Minimal assistance;Standing;Cueing for sequencing Grooming Details (indicate cue type and reason): cues to locate/use soap Upper Body Bathing: Sitting;Minimal assitance   Lower Body Bathing:  Minimal assistance;Sit to/from stand   Upper Body Dressing : Minimal assistance;Sitting   Lower Body Dressing: Minimal assistance;Sit to/from stand   Toilet Transfer: Minimal assistance;Ambulation;Regular Toilet   Toileting- Architect and Hygiene: Total assistance;Sit to/from stand       Functional mobility during ADLs: Minimal assistance;Rolling walker General ADL Comments: Pt attempting to get OOB between the bed rail and footboard upon OTs arrival.     Vision     Perception     Praxis      Pertinent Vitals/Pain Pain Assessment: No/denies pain     Hand Dominance Right   Extremity/Trunk Assessment Upper Extremity Assessment Upper Extremity Assessment: Overall WFL for tasks assessed   Lower Extremity Assessment Lower Extremity Assessment: Defer to PT evaluation       Communication Communication Communication: No difficulties   Cognition Arousal/Alertness: Awake/alert Behavior During Therapy: Impulsive Overall Cognitive Status: History of cognitive impairments - at baseline Area of Impairment: Orientation;Memory;Safety/judgement;Awareness;Problem solving Orientation Level: Disoriented to;Place;Time;Situation   Memory: Decreased short-term memory   Safety/Judgement: Decreased awareness of safety   Problem Solving: Slow processing;Difficulty sequencing;Requires verbal cues     General Comments       Exercises       Shoulder Instructions      Home Living Family/patient expects to be discharged to:: Private residence Living Arrangements: Children (son) Available Help at Discharge: Family;Available 24 hours/day Type of Home: Mobile home Home Access: Stairs to enter Entrance Stairs-Number of Steps: 4 Entrance Stairs-Rails: Right;Left Home Layout:  One level     Bathroom Shower/Tub: Chief Strategy Officer: Standard     Home Equipment: Environmental consultant - 2 wheels;Grab bars - tub/shower   Additional Comments: pt will not use a 3 in 1  or shower seat per son      Prior Functioning/Environment Level of Independence: Independent with assistive device(s);Needs assistance  Gait / Transfers Assistance Needed: Ambulates with use of RW. ADL's / Homemaking Assistance Needed: Pt showers, dresses and toilets without assist.  Son does cooking and cleaning.        OT Diagnosis: Generalized weakness;Cognitive deficits   OT Problem List: Decreased strength;Decreased activity tolerance;Impaired balance (sitting and/or standing);Decreased cognition;Decreased safety awareness;Decreased knowledge of use of DME or AE   OT Treatment/Interventions: Self-care/ADL training;DME and/or AE instruction;Therapeutic activities;Patient/family education;Balance training    OT Goals(Current goals can be found in the care plan section) Acute Rehab OT Goals Patient Stated Goal: go home OT Goal Formulation: With family Time For Goal Achievement: 05/29/15 Potential to Achieve Goals: Good ADL Goals Pt Will Perform Grooming: with supervision;standing Pt Will Perform Upper Body Bathing: with supervision;standing Pt Will Perform Lower Body Bathing: with supervision;sit to/from stand Pt Will Perform Upper Body Dressing: with supervision;standing;sitting Pt Will Perform Lower Body Dressing: with supervision;sit to/from stand Pt Will Transfer to Toilet: with supervision;ambulating;regular height toilet Pt Will Perform Toileting - Clothing Manipulation and hygiene: with supervision;sit to/from stand Pt Will Perform Tub/Shower Transfer: Tub transfer;with supervision;grab bars;rolling walker;with caregiver independent in assisting  OT Frequency: Min 2X/week   Barriers to D/C:            Co-evaluation PT/OT/SLP Co-Evaluation/Treatment: Yes Reason for Co-Treatment: For patient/therapist safety   OT goals addressed during session: ADL's and self-care      End of Session Equipment Utilized During Treatment: Rolling walker;Gait belt Nurse  Communication: Mobility status (pt with BM)  Activity Tolerance: Patient tolerated treatment well Patient left: in bed;with call bell/phone within reach;with bed alarm set;with family/visitor present   Time: 1455-1519 OT Time Calculation (min): 24 min Charges:  OT General Charges $OT Visit: 1 Procedure OT Evaluation $Initial OT Evaluation Tier I: 1 Procedure G-Codes:    Evern Bio 05/22/2015, 4:07 PM  [phone]

## 2015-05-22 NOTE — Evaluation (Signed)
Physical Therapy Evaluation Patient Details Name: Leroy Rodriguez MRN: [id number] DOB: [birth year] Today's Date: 05/22/2015   History of Present Illness  Pt admitted with generalized weakness and difficulty ambulating as a result of UTI.  PMH: HTN, HLD, DM, vascular dementia, stroke, CAD with stent.  Clinical Impression  Patient presents with problems listed below.  Will benefit from acute PT to maximize functional mobility prior to discharge home with son.  Son provides patient with 24 hour assist.  Do not anticipate any f/u PT needs at d/c.    Follow Up Recommendations No PT follow up;Supervision/Assistance - 24 hour    Equipment Recommendations  None recommended by PT    Recommendations for Other Services       Precautions / Restrictions Precautions Precautions: Fall Restrictions Weight Bearing Restrictions: No      Mobility  Bed Mobility Overal bed mobility: Needs Assistance Bed Mobility: Supine to Sit;Sit to Supine     Supine to sit: Supervision Sit to supine: Supervision   General bed mobility comments: Supervision for safety only.  Transfers Overall transfer level: Needs assistance Equipment used: 2 person hand held assist;Rolling walker (2 wheeled) Transfers: Sit to/from Stand Sit to Stand: Min assist         General transfer comment: Verbal cues for hand placement.  Required min assist for transfers from bed, BSC, and toilet.  Patient able to stand at toilet with UE support for pericare following BM.  Ambulation/Gait Ambulation/Gait assistance: Min assist Ambulation Distance (Feet): 22 Feet Assistive device: 2 person hand held assist;Rolling walker (2 wheeled) Gait Pattern/deviations: Step-through pattern;Decreased step length - right;Decreased step length - left;Decreased stride length;Shuffle;Trunk flexed Gait velocity: decreased Gait velocity interpretation: Below normal speed for age/gender General Gait Details: Required +2 assist with hand-hold  technique.  Improved balance with use of RW.  Min assist for balance/safety.  Stairs            Wheelchair Mobility    Modified Rankin (Stroke Patients Only)       Balance Overall balance assessment: Needs assistance   Sitting balance-Leahy Scale: Fair     Standing balance support: Bilateral upper extremity supported Standing balance-Leahy Scale: Poor                               Pertinent Vitals/Pain Pain Assessment: No/denies pain    Home Living Family/patient expects to be discharged to:: Private residence Living Arrangements: Children (son) Available Help at Discharge: Family;Available 24 hours/day Type of Home: Mobile home Home Access: Stairs to enter Entrance Stairs-Rails: Doctor, general practice of Steps: 4 Home Layout: One level Home Equipment: Walker - 2 wheels;Grab bars - tub/shower Additional Comments: pt will not use a 3 in 1 or shower seat per son    Prior Function Level of Independence: Independent with assistive device(s);Needs assistance   Gait / Transfers Assistance Needed: Ambulates with use of RW.  ADL's / Homemaking Assistance Needed: Pt showers, dresses and toilets without assist.  Son does cooking and cleaning.        Hand Dominance   Dominant Hand: Right    Extremity/Trunk Assessment   Upper Extremity Assessment: Defer to OT evaluation           Lower Extremity Assessment: Generalized weakness      Cervical / Trunk Assessment: Kyphotic  Communication   Communication: No difficulties  Cognition Arousal/Alertness: Awake/alert Behavior During Therapy: Impulsive Overall Cognitive Status: Impaired/Different from baseline Area of Impairment: Orientation;Memory;Safety/judgement;Awareness;Problem  solving Orientation Level: Disoriented to;Place;Time;Situation   Memory: Decreased short-term memory   Safety/Judgement: Decreased awareness of safety   Problem Solving: Slow processing;Difficulty  sequencing;Requires verbal cues      General Comments      Exercises        Assessment/Plan    PT Assessment Patient needs continued PT services  PT Diagnosis Difficulty walking;Abnormality of gait;Generalized weakness;Altered mental status   PT Problem List Decreased strength;Decreased activity tolerance;Decreased balance;Decreased mobility;Decreased cognition;Decreased safety awareness  PT Treatment Interventions DME instruction;Gait training;Stair training;Functional mobility training;Therapeutic activities;Patient/family education   PT Goals (Current goals can be found in the Care Plan section) Acute Rehab PT Goals Patient Stated Goal: go home PT Goal Formulation: With patient/family Time For Goal Achievement: 05/29/15 Potential to Achieve Goals: Good    Frequency Min 3X/week   Barriers to discharge        Co-evaluation PT/OT/SLP Co-Evaluation/Treatment: Yes Reason for Co-Treatment: For patient/therapist safety PT goals addressed during session: Mobility/safety with mobility OT goals addressed during session: ADL's and self-care       End of Session Equipment Utilized During Treatment: Gait belt Activity Tolerance: Patient tolerated treatment well Patient left: in bed;with call bell/phone within reach;with bed alarm set;with family/visitor present Nurse Communication: Mobility status (Impulsive; had BM)         Time: 1610-9604 PT Time Calculation (min) (ACUTE ONLY): 19 min   Charges:   PT Evaluation $Initial PT Evaluation Tier I: 1 Procedure     PT G CodesVena Austria 06/16/2015, 5:48 PM Durenda Hurt. Renaldo Fiddler, [facility] Acute Rehab Services Pager [phone]

## 2015-05-22 NOTE — Progress Notes (Signed)
Utilization review completed. Bertha Stanfill, RN, BSN. 

## 2015-05-22 NOTE — Progress Notes (Signed)
PROGRESS NOTE  Leroy Rodriguez ZOX:[id number] DOB: 12/16/1940 DOA: 05/21/2015 PCP: Sissy Hoff, MD  HPI/Recap of past 24 hours: Feeling better, though not able to provide detailed history due to baseline dementia, noticed some intermittent dry cough and left lower extremity edema Son in room  Assessment/Plan: Principal Problem:   UTI (lower urinary tract infection) Active Problems:   Coronary artery disease   Essential hypertension   Hyperlipidemia   Hypertension   Stroke   Hypothyroidism   Diabetes mellitus without complication   Sepsis   Generalized weakness   AKI (acute kidney injury)   Vascular dementia  Sepsis with fever 102.3, leukocytosis, uti, worsening of renal function on admission. Improving on ivf and abx  Uti: reported recurrent uti, was just treated for uti a week ago, in the process of refer to urology by pmd. Culture pending, continue rocephin for now. Renal US showed Debris is noted within the urinary bladder with multiple bladder diverticulum present. Enlarged prostate gland is noted.  Acute renal insufficiency: likely from uti, improving. Home meds lasix and enalapril held for now, likely able to restart tomorrow.  Sob: from sepsis? Does has dry cough, cxr cardiomegaly with low lung volume. Echo pending, close monitor volume status. Swallow eval.  Edema: per hpi bilateral, today on exam, more prominent left lower extremity, venous US pending, echo pending. D/c ivf. Consider d/c norvasc if no clear reason for edema  CAD s/p stent, stable denies chest pain, troponin negative . EKG no acute changes. Continue coreg/plavix.   H/o CVA, with gait instability, but no laterality, on plavix.  Vascular dementia: at baseline, pleasant , cooperative, oriented to person and place. Not to time.  noninsulin dependent dm2, a1c pending, on ssi here.  Code Status: full  Family Communication: patient  And son  Disposition Plan: home when medically  stable   Consultants:  none  Procedures:  none  Antibiotics:  rocephin   Objective: BP 136/66 mmHg  Pulse 66  Temp(Src) 98.8 F (37.1 C) (Oral)  Resp 16  Ht  (1.702 m)  Wt 194 lb 8 oz (88.225 kg)  BMI 30.46 kg/m2  SpO2 95%  Intake/Output Summary (Last 24 hours) at 05/22/15 1627 Last data filed at 05/22/15 1412  Gross per 24 hour  Intake   1270 ml  Output      0 ml  Net   1270 ml   Filed Weights   05/21/15 2125  Weight: 194 lb 8 oz (88.225 kg)    Exam:   General:  NAD, pleasantly demented  Cardiovascular: RRR  Respiratory: CTABL  Abdomen: Soft/ND/NT, positive BS  Musculoskeletal: 1+pitting Edema left lower extremity  Neuro: baseline dementia, not oriented to time, no focal deficit.  Data Reviewed: Basic Metabolic Panel:  Recent Labs Lab 05/21/15 1750 05/22/15 0510  NA 139 139  K 4.2 3.7  CL 107 110  CO2 24 23  GLUCOSE 116* 112*  BUN 21* 20  CREATININE 1.47* 1.23  CALCIUM 9.1 8.5*   Liver Function Tests:  Recent Labs Lab 05/21/15 1750  AST 36  ALT 18  ALKPHOS 70  BILITOT 0.8  PROT 7.2  ALBUMIN 3.8   No results for input(s): LIPASE, AMYLASE in the last 168 hours. No results for input(s): AMMONIA in the last 168 hours. CBC:  Recent Labs Lab 05/21/15 1750 05/22/15 0510  WBC 21.6* 20.7*  NEUTROABS 18.2*  --   HGB 15.1 12.7*  HCT 43.8 38.3*  MCV 88.5 87.8  PLT 282 248  Cardiac Enzymes:   No results for input(s): CKTOTAL, CKMB, CKMBINDEX, TROPONINI in the last 168 hours. BNP (last 3 results)  Recent Labs  05/22/15 0510  BNP 76.6    ProBNP (last 3 results) No results for input(s): PROBNP in the last 8760 hours.  CBG:  Recent Labs Lab 05/21/15 2140 05/22/15 0806 05/22/15 1104  GLUCAP 136* 118* 177*    Recent Results (from the past 240 hour(s))  Culture, blood (routine x 2)     Status: None (Preliminary result)   Collection Time: 05/21/15  5:50 PM  Result Value Ref Range Status   Specimen  Description BLOOD RIGHT ARM  Final   Special Requests BOTTLES DRAWN AEROBIC AND ANAEROBIC  Final   Culture NO GROWTH < 24 HOURS  Final   Report Status PENDING  Incomplete  Culture, blood (routine x 2)     Status: None (Preliminary result)   Collection Time: 05/21/15  6:00 PM  Result Value Ref Range Status   Specimen Description BLOOD LEFT HAND  Final   Special Requests BOTTLES DRAWN AEROBIC AND ANAEROBIC  Final   Culture NO GROWTH < 24 HOURS  Final   Report Status PENDING  Incomplete     Studies: Dg Chest 2 View  05/21/2015   CLINICAL DATA:  Shortness of breath, weakness  EXAM: CHEST  2 VIEW  COMPARISON:  No similar prior exam is available at this institution for comparison or on [facility].  FINDINGS: Mild enlargement of the cardiomediastinal silhouette is noted. The aorta is unfolded and ectatic. Presumed prominence of the vascular pedicle noted over the right paratracheal region. Lungs are hypoaerated with crowding of the bronchovascular markings. No pleural effusion. No acute osseous abnormality.  IMPRESSION: Cardiomegaly with low lung volumes and crowding of the bronchovascular markings.  Prominence of the superior vascular pedicle.   Electronically Signed   By: Christiana Pellant M.D.   On: 05/21/2015 18:40   US Renal  05/21/2015   CLINICAL DATA:  Acute kidney injury.  EXAM: RENAL / URINARY TRACT ULTRASOUND COMPLETE  COMPARISON:  None.  FINDINGS: Right Kidney:  Length: 11.7 cm. 1.5 cm cyst is seen in upper pole. 1.9 cm cyst is seen in midpole. Echogenicity within normal limits. No mass or hydronephrosis visualized.  Left Kidney:  Length: 9.7 cm. Lobulated cyst measuring 7.3 x 5.2 x 0.5 cm is noted arising from lower pole. Echogenicity within normal limits. No mass or hydronephrosis visualized.  Bladder:  Debris is noted in the dependent portion of the bladder with multiple bladder diverticula present. Enlarged prostate gland is noted.  IMPRESSION: Bilateral renal cysts. No  hydronephrosis or renal obstruction is noted. Debris is noted within the urinary bladder with multiple bladder diverticulum present. Enlarged prostate gland is noted.   Electronically Signed   By: Lupita Raider, M.D.   On: 05/21/2015 20:51    Scheduled Meds: . amLODipine  10 mg Oral Daily  . carvedilol  12.5 mg Oral BID WC  . cefTRIAXone (ROCEPHIN)  IV  1 g Intravenous Q24H  . clopidogrel  75 mg Oral QPM  . donepezil  5 mg Oral QHS  . heparin  5,000 Units Subcutaneous 3 times per day  . insulin aspart  0-9 Units Subcutaneous TID WC  . levothyroxine  25 mcg Oral QAC breakfast  . pravastatin  80 mg Oral QHS  . sodium chloride  3 mL Intravenous Q12H    Continuous Infusions:     Time spent:  Xu,Fang MD,  PhD  [facility] Pager [phone]. If 7PM-7AM, please contact night-coverage at [email or web address], password [facility] 05/22/2015, 4:27 PM  LOS: 1 day

## 2015-05-22 NOTE — Progress Notes (Signed)
Patient arrived on unit via stretcher with ED nurse tech. Patient alert and oriented x2. Patient's son, [name], at bedside. Patient oriented to room, staff and unit. Patient placed on telemetry, CCMD notified. Skin assessment completed with two RN's. Patient has a few bruises on left lower arm and a mild rash on left lower leg. No other skin issues noted. Patient's IV clean, dry and intact. Patient denies having pain.  Safety Fall Prevention Plan was given, discussed and signed by patient. Orders have been reviewed and implemented. Call light has been placed within reach and bed alarm has been activated. RN will continue to monitor the patient.   Rivka Barbara BSN, RN  Phone Number: [phone]

## 2015-05-22 NOTE — Progress Notes (Signed)
*  Preliminary Results* Left lower extremity venous duplex completed. Left lower extremity is negative for deep vein thrombosis. There is no evidence of left Baker's cyst.  05/22/2015 6:39 PM  Gertie Fey, RVT, RDCS, RDMS

## 2015-05-23 ENCOUNTER — Inpatient Hospital Stay (HOSPITAL_COMMUNITY): Payer: Medicare Other

## 2015-05-23 DIAGNOSIS — R0600 Dyspnea, unspecified: Secondary | ICD-10-CM

## 2015-05-23 LAB — CBC WITH DIFFERENTIAL/PLATELET
Basophils Absolute: 0 10*3/uL (ref 0.0–0.1)
Basophils Relative: 0 %
Eosinophils Absolute: 0.1 10*3/uL (ref 0.0–0.7)
Eosinophils Relative: 1 %
HCT: 38.7 % — ABNORMAL LOW (ref 39.0–52.0)
Hemoglobin: 13.2 g/dL (ref 13.0–17.0)
Lymphocytes Relative: 10 %
Lymphs Abs: 1.3 10*3/uL (ref 0.7–4.0)
MCH: 30.2 pg (ref 26.0–34.0)
MCHC: 34.1 g/dL (ref 30.0–36.0)
MCV: 88.6 fL (ref 78.0–100.0)
Monocytes Absolute: 1.2 10*3/uL — ABNORMAL HIGH (ref 0.1–1.0)
Monocytes Relative: 10 %
Neutro Abs: 10.3 10*3/uL — ABNORMAL HIGH (ref 1.7–7.7)
Neutrophils Relative %: 80 %
Platelets: 213 10*3/uL (ref 150–400)
RBC: 4.37 MIL/uL (ref 4.22–5.81)
RDW: 13.4 % (ref 11.5–15.5)
WBC: 12.9 10*3/uL — ABNORMAL HIGH (ref 4.0–10.5)

## 2015-05-23 LAB — GLUCOSE, CAPILLARY
GLUCOSE-CAPILLARY: 76 mg/dL (ref 65–99)
GLUCOSE-CAPILLARY: 258 mg/dL — AB (ref 65–99)
Glucose-Capillary: 116 mg/dL — ABNORMAL HIGH (ref 65–99)
Glucose-Capillary: 159 mg/dL — ABNORMAL HIGH (ref 65–99)
Glucose-Capillary: 176 mg/dL — ABNORMAL HIGH (ref 65–99)

## 2015-05-23 LAB — MAGNESIUM: Magnesium: 2.1 mg/dL (ref 1.7–2.4)

## 2015-05-23 LAB — BASIC METABOLIC PANEL
ANION GAP: 5 (ref 5–15)
BUN: 11 mg/dL (ref 6–20)
CO2: 23 mmol/L (ref 22–32)
Calcium: 8.2 mg/dL — ABNORMAL LOW (ref 8.9–10.3)
Chloride: 107 mmol/L (ref 101–111)
Creatinine, Ser: 1.29 mg/dL — ABNORMAL HIGH (ref 0.61–1.24)
GFR calc Af Amer: >60 mL/min (ref >60)
GFR, EST NON AFRICAN AMERICAN: 52 mL/min — AB (ref >60)
GLUCOSE: 113 mg/dL — AB (ref 65–99)
POTASSIUM: 3.6 mmol/L (ref 3.5–5.1)
Sodium: 135 mmol/L (ref 135–145)

## 2015-05-23 LAB — HEMOGLOBIN A1C
HEMOGLOBIN A1C: 6.3 % — AB (ref 4.8–5.6)
MEAN PLASMA GLUCOSE: 134 mg/dL

## 2015-05-23 LAB — UREA NITROGEN, URINE: Urea Nitrogen, Ur: 465 mg/dL

## 2015-05-23 MED ORDER — TAMSULOSIN HCL 0.4 MG PO CAPS
0.4000 mg | ORAL_CAPSULE | Freq: Every day | ORAL | Status: DC
  Administered 2015-05-23 – 2015-05-26 (×4): 0.4 mg via ORAL
  Filled 2015-05-23 (×3): qty 1

## 2015-05-23 NOTE — Plan of Care (Signed)
Problem: Phase I Progression Outcomes Goal: EF % per last Echo/documented,Core Reminder form on chart Outcome: Completed/Met Date Met:  05/23/15 60-65% EF     

## 2015-05-23 NOTE — Care Management Important Message (Signed)
Important Message  Patient Details  Name: Leroy Rodriguez MRN: [id number] Date of Birth: 1940/11/14   Medicare Important Message Given:  Yes-second notification given    Bernadette Hoit 05/23/2015, 11:16 AM

## 2015-05-23 NOTE — Progress Notes (Signed)
PROGRESS NOTE  Leroy Rodriguez ZOX:[id number] DOB: [birth year] DOA: 05/21/2015 PCP: Sissy Hoff, MD  HPI/Recap of past 24 hours:  Pleasantly demented and left lower extremity edema subsided Son not in room  Assessment/Plan: Principal Problem:   UTI (lower urinary tract infection) Active Problems:   Coronary artery disease   Essential hypertension   Hyperlipidemia   Hypertension   Stroke   Hypothyroidism   Diabetes mellitus without complication   Sepsis   Generalized weakness   AKI (acute kidney injury)   Vascular dementia  Sepsis with fever 102.3, leukocytosis, uti, worsening of renal function on admission. Improving on ivf and abx  UTI:  reported recurrent uti, was just treated for uti a week ago, in the process of refer to urology by pmd.  Urine culture + g-rods, Blood Culture negative, continue rocephin for now.  Renal US showed Debris is noted within the urinary bladder with multiple bladder diverticulum present. Enlarged prostate gland is noted.  Acute renal insufficiency:  likely from uti, improving. Home meds lasix and enalapril held for now, likely able to restart tomorrow.  Sob: from sepsis? Does has dry cough, cxr cardiomegaly with low lung volume. Echo grade I diastolic dysfunction, adequate LVEF, close monitor volume status. Swallow eval low risk of aspiration. Incentive spirometer  Edema: per hpi bilateral, today on exam, more prominent left lower extremity, venous US preliminary no DVT., echo grade i diastolic dysfunction. D/c ivf.  d/c norvasc(for side effect of  Edema) Edema better today  CAD s/p stent, stable denies chest pain, troponin negative . EKG no acute changes. Continue coreg/plavix.   H/o CVA, with gait instability, but no laterality, on plavix.  Vascular dementia: at baseline, pleasant , cooperative, oriented to person and place. Not to time.  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  Code Status: full  Family Communication:  patient  Disposition Plan: home when medically stable   Consultants:  none  Procedures:  none  Antibiotics:  rocephin   Objective: BP 135/76 mmHg  Pulse 64  Temp(Src) 98.7 F (37.1 C) (Oral)  Resp 22  Ht  (1.702 m)  Wt 194 lb 8 oz (88.225 kg)  BMI 30.46 kg/m2  SpO2 96%  Intake/Output Summary (Last 24 hours) at 05/23/15 1506 Last data filed at 05/23/15 0810  Gross per 24 hour  Intake    770 ml  Output      0 ml  Net    770 ml   Filed Weights   05/21/15 2125  Weight: 194 lb 8 oz (88.225 kg)    Exam:   General:  NAD, pleasantly demented  Cardiovascular: RRR  Respiratory: CTABL  Abdomen: Soft/ND/NT, positive BS  Musculoskeletal: resolving 1+pitting Edema left lower extremity  Neuro: baseline dementia, not oriented to time, no focal deficit.  Data Reviewed: Basic Metabolic Panel:  Recent Labs Lab 05/21/15 1750 05/22/15 0510 05/23/15 0503  NA 139 139 135  K 4.2 3.7 3.6  CL 107 110 107  CO2 GLUCOSE 116* 112* 113*  BUN 21* 20 11  CREATININE 1.47* 1.23 1.29*  CALCIUM 9.1 8.5* 8.2*  MG  --   --  2.1   Liver Function Tests:  Recent Labs Lab 05/21/15 1750  AST 36  ALT 18  ALKPHOS 70  BILITOT 0.8  PROT 7.2  ALBUMIN 3.8   No results for input(s): LIPASE, AMYLASE in the last 168 hours. No results for input(s): AMMONIA in the last 168 hours. CBC:  Recent Labs  Lab 05/21/15 1750 05/22/15 0510 05/23/15 0503  WBC 21.6* 20.7* 12.9*  NEUTROABS 18.2*  --  10.3*  HGB 15.1 12.7* 13.2  HCT 43.8 38.3* 38.7*  MCV 88.5 87.8 88.6  PLT 282 248 213   Cardiac Enzymes:   No results for input(s): CKTOTAL, CKMB, CKMBINDEX, TROPONINI in the last 168 hours. BNP (last 3 results)  Recent Labs  05/22/15 0510  BNP 76.6    ProBNP (last 3 results) No results for input(s): PROBNP in the last 8760 hours.  CBG:  Recent Labs Lab 05/22/15 1104 05/22/15 1647 05/22/15 2103 05/23/15 0730 05/23/15 1151  GLUCAP 177* 76 131* 116*  258*    Recent Results (from the past 240 hour(s))  Culture, blood (routine x 2)     Status: None (Preliminary result)   Collection Time: 05/21/15  5:50 PM  Result Value Ref Range Status   Specimen Description BLOOD RIGHT ARM  Final   Special Requests BOTTLES DRAWN AEROBIC AND ANAEROBIC 5ML  Final   Culture NO GROWTH 2 DAYS  Fina l [phone], physiologicaltisttus PENDING  Incomplete  Culture, blood (routine x 2)     Status: None (Preliminary result )[phone], physiologicaltist Time: 05/21/15  6:00 PM  Result Value Ref Range Status   Specimen Description BLOOD LEFT HAND  Fin a<([phone] Scientist, physio lo917LMarla640-660-[place]42Scientist, phy si(7Marla431 7[place]55Scientist, phy si(704)[phone], phy si(228Marla805-40[place]2-Scientist, physiologicaltistharin GLaird HospitalrNew JerseVeryl SpE<MSwazilan8Marland Kitche236-7Ervin Kna16109 LoSharin GMedical City MckinneyrNew JerseVeryl SpL<MSwazilan8Marland Kitche224-2Ervin Kna16<ME ASSharin GLexingto n [phone], physiologicaltisterrNew JerseVeryl SpStar J<MSwazilan8Marland Kitche541Ervin Kna16109Loren R<MEASUREME [phone], phy si902OMarla604-27[place]8-Scientist, phy si540[place] ShoMarla786 47[place]9 Scientist, phy [phone] 688-[place]66S [phone]tist, phy si6Marla978 083 76KentuckyOcScientist, physiologicaltistiologicaltistverlake Ambulatory Surgery Center LLCrNew JerseVeryl SpCle<MSwazilan8Marland Kitche951-6Ervi n (413MiMarla878-052-75SKentuck [phone] 520-[place]66Scientist, physiologicaltistphysiologicaltistEMENT>10Sharin GHoulton Regional HospitalrNew JerseVeryl SpWas<MSwazilan8Marland Ki tcSharin GFallon Medical Complex HospitalrNew JerseVeryl SpBa<MSwazilan8Marland Kitche410 -6Sharin GSurical Center Of Makena LLCrNew JerseVeryl SpTucson <MSwazilan8Marland Kit chSharin GUnivers [phone]-8Scientist, phy si814PineMarla540-420-[place]60Scientist, ph [phone] CampKentuckybeScien ti306RoMarla517-132-[place]59Scientist, physiologicaltistgicaltistttoney Brook Southampton HospitalrNew JerseVeryl<MSwazilan8Marland Kitche360-7Ervin Kna16<MEASUREME NMarla475-840[place]-6Scientist, physiologicaltistittson Memorial HospitalrNew JerseVeryl SpNel<MSwazilan8Marland Kitche405-2Ervin Kna1 6<(5Marla705 162 4[place]4MScient isMarla201-484-54KentuckyShScientist, phy <M(732)ClaMarla984 345 23BeKentuckydfScientist, phy [phone] CrKentuckyeeScie nt(54Marla848 682 5[place]5SScientist, physiologicaltistogicaltisti306FortMarla504-625-[place]80Scientist, physiologicaltisticaltist10Sharin GPal[facility] Surgicenter LLCrNew JerseVeryl SpPrair<MSwazilan8Marland Kitche346E rvSharin GMission Valley Heights Surgery CenterrNew JerseVeryl SpFl<MSwazilan8 ar(618Marla334-455-[place]53Scientist, physiologicaltist980)2Ervin Kna16 10Sharin GDSharin GMt La[facility] Cen te56Marla514-488-84HaddKentuckyonScientist, physiologicaltistseVeryl S<MSwazilan8Marland Kitche519-5Ervin Kna16 10Sharin GKaiser Found Hsp-AntiochrNew Jerse Ve(806MMarla9175[place]10Scientist, physiologicaltistlan8Marland Kitche930-6Er vi3Marla909-074[place]-2Scient is815Marla214-714[place]-2Scientist, physiologicaltisticaltistren RacerBlazerspitalrNew JerseVery l Sharin GNational Jewish HealthrNew JerseVeryl SpOcea<MSwazilan8Marland Kitche(226)5Ervin Kna16 10Sharin GUk Healthcare Good Samaritan HospitalrNew JerseVeryl SpDe<MSwazilan8Marland Kitche713-0Ervin Kna16109Loren RacerBlazerrland Kitche680-7Ervin Kna16109Loren RacerBlazeracerBlazerLoren RacerBlazern RacerBlazer9Loren RacerBlazerMyrna BlazerTTLES DRAWN AEROBIC AND ANAEROBIC 5ML  Final   Culture NO GROWTH 2 DAYS  Final   Report Status PENDING  Incomplete  Urine culture     Status: None (Preliminary result)   Collection Time: 05/21/15  6:15 PM  Result Value Ref Range Status   Specimen Description URINE, CLEAN CATCH  Final   Special Requests NONE  Final   Culture >=100,000 COLONIES/mL GRAM NEGATIVE RODS  Final   Report Status PENDING  Incomplete     Studies: No results found.  Scheduled Meds: . amLODipine  10 mg Oral Daily  . carvedilol  12.5 mg Oral BID WC  . cefTRIAXone (ROCEPHIN)  IV  1 g Intravenous Q24H  . clopidogrel  75 mg Oral QPM  . donepezKentuckyil Nada626-VeedeAl241 KentuckyHudNada(419)TavAl7468 Gr81.1en KentuckyBerNada(361)StAl369 Westpor81.1 StVibra Hospital Of [id number] Corky Craftsrtzell Eye Center LLP Dba The Surgery Center Of CentD720 2 [email or web address], password [email or web address] 05/23/2015, 3:06 PM  LOS: 2 days

## 2015-05-23 NOTE — Progress Notes (Signed)
  Echocardiogram 2D Echocardiogram has been performed.  Tye Savoy 05/23/2015, 9:07 AM

## 2015-05-23 NOTE — Evaluation (Signed)
Clinical/Bedside Swallow Evaluation Patient Details  Name: Leroy Rodriguez MRN: [id number] Date of Birth: [birth year]  Today's Date: 05/23/2015 Time: SLP Start Time (ACUTE ONLY): 0920 SLP Stop Time (ACUTE ONLY): 0940 SLP Time Calculation (min) (ACUTE ONLY): 20 min  Past Medical History:  Past Medical History  Diagnosis Date  . Coronary artery disease   . Hypertension   . Hyperlipidemia   . Type 2 diabetes mellitus   . Memory loss   . Stroke   . Hypothyroidism   . Hyperlipemia   . Peripheral edema   . Mental status change   . CAD (coronary artery disease)    Past Surgical History:  Past Surgical History  Procedure Laterality Date  . Coronary stent placement     HPI:  is a 74 y.o. male with PMH of hypertension, hyperlipidemia, diabetes mellitus, hypothyroidism, vascular dementia, stroke, CAD, s/p of stent, who presents with generalized weakness.   Assessment / Plan / Recommendation Clinical Impression  Patient presents with a functional swallow without any overt s/s of aspiration. Patient able to masticate, formulate and transit solid texture boluses without difficulty and exhibited good laryngeal elevation during swallows of all P.O.'s. Patient did exhibit weak breath support and SOB when consuming P.O.'s, and clinician recommended to patient and son that he take rest breaks while eating/drinking and to stop eating or drinking if he gets very fatigued and/or SOB.    Aspiration Risk  Mild    Diet Recommendation Age appropriate regular solids;Thin   Medication Administration: Whole meds with liquid Compensations: Small sips/bites;Slow rate;Other (Comment) (take rest breaks if SOB)    Other  Recommendations Oral Care Recommendations: Oral care BID   Follow Up Recommendations    N/A   Frequency and Duration   N/A     Pertinent Vitals/Pain     SLP Swallow Goals  N/A   Swallow Study Prior Functional Status       General Date of Onset: 05/21/15 Other Pertinent  Information: is a 74 y.o. male with PMH of hypertension, hyperlipidemia, diabetes mellitus, hypothyroidism, vascular dementia, stroke, CAD, s/p of stent, who presents with generalized weakness. Type of Study: Bedside swallow evaluation Previous Swallow Assessment: N/A Diet Prior to this Study: Regular;Thin liquids Temperature Spikes Noted: No Respiratory Status: Room air History of Recent Intubation: No Behavior/Cognition: Alert;Cooperative;Pleasant mood Oral Cavity - Dentition: Missing dentition;Poor condition (edentulous on top (son reports that patient lost upper dentures a while ago and he has not had them replaced yet.) Missing dentition, poor condition on bottom) Self-Feeding Abilities: Able to feed self Patient Positioning: Upright in bed Baseline Vocal Quality: Low vocal intensity Volitional Cough: Weak Volitional Swallow: Able to elicit    Oral/Motor/Sensory Function Overall Oral Motor/Sensory Function: Appears within functional limits for tasks assessed   Ice Chips Ice chips: Not tested   Thin Liquid Thin Liquid: Within functional limits Presentation: Cup;Straw;Self Fed Other Comments: No overt s/s aspiration or difficulty with cup sips or straw sips of thin liquids    Nectar Thick Nectar Thick Liquid: Not tested   Honey Thick Honey Thick Liquid: Not tested   Puree Puree: Within functional limits Presentation: Self Fed;Spoon Other Comments: No overt s/s of aspiration or difficulty with puree solids   Solid   GO    Solid: Within functional limits Presentation: Self Fed Other Comments: Patient able to masticate hard solid (graham cracker) with no oral delays, no overt s/s aspiration or difficulty. Patient stated that he is able to eat all consistencies at home even though he  does not have upper dentures       Leroy Rodriguez Leroy Rodriguez 05/23/2015,1:10 PM   Leroy Nevin, MA, CCC-SLP 05/23/2015 1:10 PM

## 2015-05-23 NOTE — Progress Notes (Signed)
Physical Therapy Treatment Patient Details Name: Brandonlee Navis MRN: [id number] DOB: June 21, 1941 Today's Date: 05/23/2015    History of Present Illness Pt admitted with generalized weakness and difficulty ambulating as a result of UTI.  PMH: HTN, HLD, DM, vascular dementia, stroke, CAD with stent.    PT Comments    Patient making improvement with mobility and gait.  Follow Up Recommendations  No PT follow up;Supervision/Assistance - 24 hour     Equipment Recommendations  None recommended by PT    Recommendations for Other Services       Precautions / Restrictions Precautions Precautions: Fall Restrictions Weight Bearing Restrictions: No    Mobility  Bed Mobility Overal bed mobility: Needs Assistance Bed Mobility: Supine to Sit;Sit to Supine     Supine to sit: Supervision Sit to supine: Supervision   General bed mobility comments: Supervision for safety only.  Transfers Overall transfer level: Needs assistance Equipment used: Rolling walker (2 wheeled) Transfers: Sit to/from Stand Sit to Stand: Min assist         General transfer comment: Verbal cues for hand placement.  Assist to steady during transfer.  Ambulation/Gait Ambulation/Gait assistance: Min assist;Mod assist Ambulation Distance (Feet): 62 Feet Assistive device: Rolling walker (2 wheeled) Gait Pattern/deviations: Step-through pattern;Decreased step length - right;Decreased step length - left;Decreased stride length;Shuffle;Trunk flexed Gait velocity: decreased Gait velocity interpretation: Below normal speed for age/gender General Gait Details: Verbal cues for safe use of RW.  Required mod assist to maneuver RW safely during turns.  Patient with flexed posture, with RW too far ahead of himself.  Verbal and tactile cues to step into RW and stand upright for safety.  Able to stay within RW for 10', then back to flexed posture.   Stairs            Wheelchair Mobility    Modified Rankin  (Stroke Patients Only)       Balance           Standing balance support: Bilateral upper extremity supported Standing balance-Leahy Scale: Poor                      Cognition Arousal/Alertness: Awake/alert Behavior During Therapy: Impulsive Overall Cognitive Status: Impaired/Different from baseline Area of Impairment: Orientation;Memory;Safety/judgement;Awareness;Problem solving Orientation Level: Disoriented to;Place;Time;Situation   Memory: Decreased short-term memory   Safety/Judgement: Decreased awareness of safety   Problem Solving: Slow processing;Difficulty sequencing;Requires verbal cues      Exercises      General Comments        Pertinent Vitals/Pain Pain Assessment: No/denies pain    Home Living                      Prior Function            PT Goals (current goals can now be found in the care plan section) Progress towards PT goals: Progressing toward goals    Frequency  Min 3X/week    PT Plan Current plan remains appropriate    Co-evaluation             End of Session Equipment Utilized During Treatment: Gait belt Activity Tolerance: Patient tolerated treatment well;Patient limited by fatigue Patient left: in bed;with call bell/phone within reach;with bed alarm set;with family/visitor present     Time: 4098-1191 PT Time Calculation (min) (ACUTE ONLY): 12 min  Charges:  $Gait Training: 8-22 mins  G Codes:      Vena Austria 05/23/2015, 3:44 PM Durenda Hurt. Renaldo Fiddler, [facility] Acute Rehab Services Pager [phone]

## 2015-05-24 LAB — CBC
HEMATOCRIT: 39.2 % (ref 39.0–52.0)
Hemoglobin: 13.1 g/dL (ref 13.0–17.0)
MCH: 29.5 pg (ref 26.0–34.0)
MCHC: 33.4 g/dL (ref 30.0–36.0)
MCV: 88.3 fL (ref 78.0–100.0)
Platelets: 251 10*3/uL (ref 150–400)
RBC: 4.44 MIL/uL (ref 4.22–5.81)
RDW: 13.2 % (ref 11.5–15.5)
WBC: 8.1 10*3/uL (ref 4.0–10.5)

## 2015-05-24 LAB — GLUCOSE, CAPILLARY
GLUCOSE-CAPILLARY: 116 mg/dL — AB (ref 65–99)
GLUCOSE-CAPILLARY: 253 mg/dL — AB (ref 65–99)
GLUCOSE-CAPILLARY: 121 mg/dL — AB (ref 65–99)
Glucose-Capillary: 216 mg/dL — ABNORMAL HIGH (ref 65–99)

## 2015-05-24 LAB — BASIC METABOLIC PANEL
ANION GAP: 10 (ref 5–15)
BUN: 15 mg/dL (ref 6–20)
CO2: 21 mmol/L — AB (ref 22–32)
Calcium: 8.6 mg/dL — ABNORMAL LOW (ref 8.9–10.3)
Chloride: 107 mmol/L (ref 101–111)
Creatinine, Ser: 1.39 mg/dL — ABNORMAL HIGH (ref 0.61–1.24)
GFR, EST AFRICAN AMERICAN: 55 mL/min — AB (ref >60)
GFR, EST NON AFRICAN AMERICAN: 48 mL/min — AB (ref >60)
GLUCOSE: 120 mg/dL — AB (ref 65–99)
POTASSIUM: 3.9 mmol/L (ref 3.5–5.1)
Sodium: 138 mmol/L (ref 135–145)

## 2015-05-24 LAB — MAGNESIUM: Magnesium: 2.1 mg/dL (ref 1.7–2.4)

## 2015-05-24 NOTE — Progress Notes (Signed)
PROGRESS NOTE  Leroy Rodriguez JYN:[id number] DOB: September 07, 1940 DOA: 05/21/2015 PCP: Sissy Hoff, MD  HPI/Recap of past 24 hours:  Pleasantly demented and not able to provide history, son and daughter in room Urine output not accurate, patient will not keep foley in. Incontinence.  Assessment/Plan: Principal Problem:   UTI (lower urinary tract infection) Active Problems:   Coronary artery disease   Essential hypertension   Hyperlipidemia   Hypertension   Stroke   Hypothyroidism   Diabetes mellitus without complication   Sepsis   Generalized weakness   AKI (acute kidney injury)   Vascular dementia  Sepsis with fever 102.3, leukocytosis, uti, worsening of renal function on admission. Improving on ivf and abx  UTI:  reported recurrent uti, was just treated for uti a week ago, in the process of refer to urology by pmd.  Urine culture + g-rods, Blood Culture negative, continue rocephin for now.  Renal US showed Debris is noted within the urinary bladder with multiple bladder diverticulum present. Enlarged prostate gland is noted. Start flomax.  Acute renal insufficiency:  likely from uti, improving. Home meds lasix and enalapril held for now, cr remain mildly elevate,continue hold lasix/enalapril  Sob: from sepsis? Does has dry cough, cxr cardiomegaly with low lung volume. Echo grade I diastolic dysfunction, adequate LVEF, close monitor volume status. Swallow eval low risk of aspiration. Incentive spirometer  Edema: per hpi bilateral, today on exam, more prominent left lower extremity, venous US preliminary no DVT., echo grade i diastolic dysfunction. D/c ivf.  d/c norvasc(for side effect of  Edema) Edema better today  CAD s/p stent, stable denies chest pain, troponin negative . EKG no acute changes. Continue coreg/plavix.   H/o CVA, with gait instability, but no laterality, on plavix.  Vascular dementia: at baseline, pleasant , cooperative, oriented to person and place.  Not to time.  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  Code Status: full  Family Communication: patient, son and daughter  Disposition Plan: home when medically stable, awaiting for urine culture result   Consultants:  none  Procedures:  none  Antibiotics:  rocephin   Objective: BP 131/76 mmHg  Pulse 64  Temp(Src) 98.5 F (36.9 C) (Oral)  Resp 18  Ht  (1.702 m)  Wt 194 lb 8 oz (88.225 kg)  BMI 30.46 kg/m2  SpO2 98%  Intake/Output Summary (Last 24 hours) at 05/24/15 1716 Last data filed at 05/24/15 1404  Gross per 24 hour  Intake    990 ml  Output      0 ml  Net    990 ml   Filed Weights   05/21/15 2125  Weight: 194 lb 8 oz (88.225 kg)    Exam:   General:  NAD, pleasantly demented  Cardiovascular: RRR  Respiratory: CTABL  Abdomen: Soft/ND/NT, positive BS  Musculoskeletal: resolving 1+pitting Edema left lower extremity  Neuro: baseline dementia, not oriented to time, no focal deficit.  Data Reviewed: Basic Metabolic Panel:  Recent Labs Lab 05/21/15 1750 05/22/15 0510 05/23/15 0503 05/24/15 0446  NA 139 139 135 138  K 4.2 3.7 3.6 3.9  CL 107 110 107 107  CO2 21*  GLUCOSE 116* 112* 113* 120*  BUN 21* CREATININE 1.47* 1.23 1.29* 1.39*  CALCIUM 9.1 8.5* 8.2* 8.6*  MG  --   --  2.1 2.1   Liver Function Tests:  Recent Labs Lab 05/21/15 1750  AST 36  ALT 18  ALKPHOS 70  BILITOT  0.8  PROT 7.2  ALBUMIN 3.8   No results for input(s): LIPASE, AMYLASE in the last 168 hours. No results for input(s): AMMONIA in the last 168 hours. CBC:  Recent Labs Lab 05/21/15 1750 05/22/15 0510 05/23/15 0503 05/24/15 0446  WBC 21.6* 20.7* 12.9* 8.1  NEUTROABS 18.2*  --  10.3*  --   HGB 15.1 12.7* 13.2 13.1  HCT 43.8 38.3* 38.7* 39.2  MCV 88.5 87.8 88.6 88.3  PLT 282 248 213 251   Cardiac Enzymes:   No results for input(s): CKTOTAL, CKMB, CKMBINDEX, TROPONINI in the last 168 hours. BNP (last 3  results)  Recent Labs  05/22/15 0510  BNP 76.6    ProBNP (last 3 results) No results for input(s): PROBNP in the last 8760 hours.  CBG:  Recent Labs Lab 05/23/15 1630 05/23/15 2124 05/24/15 0745 05/24/15 1140 05/24/15 1625  GLUCAP 159* 176* 116* 253* 121*    Recent Results (from the past 240 hour(s))  Culture, blood (routine x 2)     Status: None (Preliminary result)   Collection Time: 05/21/15  5:50 PM  Result Value Ref Range Status   Specimen Description BLOOD RIGHT ARM  Final   Special Requests BOTTLES DRAWN AEROBIC AND ANAEROBIC  Final   Culture NO GROWTH 3 DAYS  Final   Report Status PENDING  Incomplete  Culture, blood (routine x 2)     Status: None (Preliminary result)   Collection Time: 05/21/15  6:00 PM  Result Value Ref Range Status   Specimen Description BLOOD LEFT HAND  Final   Special Requests BOTTLES DRAWN AEROBIC AND ANAEROBIC  Final   Culture NO GROWTH 3 DAYS  Final   Report Status PENDING  Incomplete  Urine culture     Status: None (Preliminary result)   Collection Time: 05/21/15  6:15 PM  Result Value Ref Range Status   Specimen Description URINE, CLEAN CATCH  Final   Special Requests NONE  Final   Culture   Final    >=100,000 COLONIES/mL GRAM NEGATIVE RODS CULTURE REINCUBATED FOR BETTER GROWTH    Report Status PENDING  Incomplete     Studies: No results found.  Scheduled Meds: . carvedilol  12.5 mg Oral BID WC  . cefTRIAXone (ROCEPHIN)  IV  1 g Intravenous Q24H  . clopidogrel  75 mg Oral QPM  . donepezil  5 mg Oral QHS  . heparin  5,000 Units Subcutaneous 3 times per day  . insulin aspart  0-9 Units Subcutaneous TID WC  . levothyroxine  25 mcg Oral QAC breakfast  . pravastatin  80 mg Oral QHS  . sodium chloride  3 mL Intravenous Q12H  . tamsulosin  0.4 mg Oral Daily    Continuous Infusions:     Time spent:  Xu,Fang MD, PhD  [facility] Pager [phone]. If 7PM-7AM, please contact night-coverage at  [email or web address], password [facility] 05/24/2015, 5:16 PM  LOS: 3 days

## 2015-05-25 LAB — BASIC METABOLIC PANEL
Anion gap: 7 (ref 5–15)
BUN: 12 mg/dL (ref 6–20)
CO2: 26 mmol/L (ref 22–32)
Calcium: 8.4 mg/dL — ABNORMAL LOW (ref 8.9–10.3)
Chloride: 106 mmol/L (ref 101–111)
Creatinine, Ser: 1.29 mg/dL — ABNORMAL HIGH (ref 0.61–1.24)
GFR, EST NON AFRICAN AMERICAN: 52 mL/min — AB (ref >60)
Glucose, Bld: 118 mg/dL — ABNORMAL HIGH (ref 65–99)
POTASSIUM: 3.6 mmol/L (ref 3.5–5.1)
SODIUM: 139 mmol/L (ref 135–145)

## 2015-05-25 LAB — URINE CULTURE

## 2015-05-25 LAB — GLUCOSE, CAPILLARY
GLUCOSE-CAPILLARY: 117 mg/dL — AB (ref 65–99)
Glucose-Capillary: 133 mg/dL — ABNORMAL HIGH (ref 65–99)
Glucose-Capillary: 119 mg/dL — ABNORMAL HIGH (ref 65–99)
Glucose-Capillary: 181 mg/dL — ABNORMAL HIGH (ref 65–99)

## 2015-05-25 MED ORDER — SENNOSIDES-DOCUSATE SODIUM 8.6-50 MG PO TABS
2.0000 | ORAL_TABLET | Freq: Two times a day (BID) | ORAL | Status: DC
  Administered 2015-05-25 – 2015-05-26 (×3): 2 via ORAL
  Filled 2015-05-25 (×3): qty 2

## 2015-05-25 NOTE — Progress Notes (Signed)
PROGRESS NOTE  Leroy Rodriguez ZOX:[id number] DOB: 04-22-1941 DOA: 05/21/2015 PCP: Sissy Hoff, MD  HPI/Recap of past 24 hours:  Pleasantly demented and not able to provide history,  Will not use urinal , will not keep foley on Son in room  Assessment/Plan: Principal Problem:   UTI (lower urinary tract infection) Active Problems:   Coronary artery disease   Essential hypertension   Hyperlipidemia   Hypertension   Stroke   Hypothyroidism   Diabetes mellitus without complication   Sepsis   Generalized weakness   AKI (acute kidney injury)   Vascular dementia  Sepsis with fever 102.3, leukocytosis, uti, worsening of renal function on admission. Improving on ivf and abx, continue abx, d/c ivf  UTI:  reported recurrent uti, was just treated for uti a week ago, in the process of refer to urology by pmd.  Urine culture + g-rods, Blood Culture negative, continue rocephin for now.  Renal US showed Debris is noted within the urinary bladder with multiple bladder diverticulum present. Enlarged prostate gland is noted. Start flomax.  Acute renal insufficiency:  likely from uti, improving. Home meds lasix and enalapril held for now, cr remain mildly elevate,continue hold lasix/enalapril  Sob: from sepsis? Does has dry cough, cxr cardiomegaly with low lung volume. Echo grade I diastolic dysfunction, adequate LVEF, close monitor volume status. Swallow eval low risk of aspiration. Incentive spirometer  Edema: per hpi bilateral, today on exam, more prominent left lower extremity, venous US preliminary no DVT., echo grade i diastolic dysfunction. D/c ivf.  d/c norvasc(for side effect of  Edema) Edema better today  CAD s/p stent, stable denies chest pain, troponin negative . EKG no acute changes. Continue coreg/plavix.   H/o CVA, with gait instability, but no laterality, on plavix.  Vascular dementia: at baseline, pleasant , cooperative, oriented to person and place. Not to  time.  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  Code Status: full  Family Communication: patient, son   Disposition Plan: home tomorrow with urology follow up   Consultants:  none  Procedures:  none  Antibiotics:  rocephin   Objective: BP 136/81 mmHg  Pulse 63  Temp(Src) 98 F (36.7 C) (Oral)  Resp 17  Ht  (1.702 m)  Wt 196 lb 13.9 oz (89.3 kg)  BMI 30.83 kg/m2  SpO2 97% No intake or output data in the 24 hours ending 05/25/15 1719 Filed Weights   05/21/15 2125 05/24/15 2137  Weight: 194 lb 8 oz (88.225 kg) 196 lb 13.9 oz (89.3 kg)    Exam:   General:  NAD, pleasantly demented  Cardiovascular: RRR  Respiratory: CTABL  Abdomen: Soft/ND/NT, positive BS  Musculoskeletal: resolving 1+pitting Edema left lower extremity  Neuro: baseline dementia, not oriented to time, no focal deficit.  Data Reviewed: Basic Metabolic Panel:  Recent Labs Lab 05/21/15 1750 05/22/15 0510 05/23/15 0503 05/24/15 0446 05/25/15 0530  NA 139 139 135 138 139  K 4.2 3.7 3.6 3.9 3.6  CL 107 110 107 107 106  CO2 21* 26  GLUCOSE 116* 112* 113* 120* 118*  BUN 21* CREATININE 1.47* 1.23 1.29* 1.39* 1.29*  CALCIUM 9.1 8.5* 8.2* 8.6* 8.4*  MG  --   --  2.1 2.1  --    Liver Function Tests:  Recent Labs Lab 05/21/15 1750  AST 36  ALT 18  ALKPHOS 70  BILITOT 0.8  PROT 7.2  ALBUMIN 3.8   No results for input(s): LIPASE,  AMYLASE in the last 168 hours. No results for input(s): AMMONIA in the last 168 hours. CBC:  Recent Labs Lab 05/21/15 1750 05/22/15 0510 05/23/15 0503 05/24/15 0446  WBC 21.6* 20.7* 12.9* 8.1  NEUTROABS 18.2*  --  10.3*  --   HGB 15.1 12.7* 13.2 13.1  HCT 43.8 38.3* 38.7* 39.2  MCV 88.5 87.8 88.6 88.3  PLT 282 248 213 251   Cardiac Enzymes:   No results for input(s): CKTOTAL, CKMB, CKMBINDEX, TROPONINI in the last 168 hours. BNP (last 3 results)  Recent Labs  05/22/15 0510  BNP 76.6    ProBNP (last  3 results) No results for input(s): PROBNP in the last 8760 hours.  CBG:  Recent Labs Lab 05/24/15 1140 05/24/15 1625 05/24/15 2133 05/25/15 0805 05/25/15 1216  GLUCAP 253* 121* 216* 117* 133*    Recent Results (from the past 240 hour(s))  Culture, blood (routine x 2)     Status: None (Preliminary result)   Collection Time: 05/21/15  5:50 PM  Result Value Ref Range Status   Specimen Description BLOOD RIGHT ARM  Final   Special Requests BOTTLES DRAWN AEROBIC AND ANAEROBIC  Final   Culture NO GROWTH 4 DAYS  Final   Report Status PENDING  Incomplete  Culture, blood (routine x 2)     Status: None (Preliminary result)   Collection Time: 05/21/15  6:00 PM  Result Value Ref Range Status   Specimen Description BLOOD LEFT HAND  Final   Special Requests BOTTLES DRAWN AEROBIC AND ANAEROBIC  Final   Culture NO GROWTH 4 DAYS  Final   Report Status PENDING  Incomplete  Urine culture     Status: None   Collection Time: 05/21/15  6:15 PM  Result Value Ref Range Status   Specimen Description URINE, CLEAN CATCH  Final   Special Requests NONE  Final   Culture   Final    >=100,000 COLONIES/mL ESCHERICHIA COLI >=100,000 COLONIES/mL ENTEROBACTER CLOACAE    Report Status 05/25/2015 FINAL  Final   Organism ID, Bacteria ESCHERICHIA COLI  Final   Organism ID, Bacteria ENTEROBACTER CLOACAE  Final      Susceptibility   Enterobacter cloacae - MIC*    CEFAZOLIN >=64 RESISTANT Resistant     CEFTRIAXONE <=1 SENSITIVE Sensitive     CIPROFLOXACIN <=0.25 SENSITIVE Sensitive     GENTAMICIN <=1 SENSITIVE Sensitive     IMIPENEM <=0.25 SENSITIVE Sensitive     NITROFURANTOIN 32 SENSITIVE Sensitive     TRIMETH/SULFA <=20 SENSITIVE Sensitive     PIP/TAZO <=4 SENSITIVE Sensitive     * >=100,000 COLONIES/mL ENTEROBACTER CLOACAE   Escherichia coli - MIC*    AMPICILLIN >=32 RESISTANT Resistant     CEFAZOLIN <=4 SENSITIVE Sensitive     CEFTRIAXONE <=1 SENSITIVE Sensitive     CIPROFLOXACIN <=0.25  SENSITIVE Sensitive     GENTAMICIN <=1 SENSITIVE Sensitive     IMIPENEM <=0.25 SENSITIVE Sensitive     NITROFURANTOIN <=16 SENSITIVE Sensitive     TRIMETH/SULFA <=20 SENSITIVE Sensitive     AMPICILLIN/SULBACTAM >=32 RESISTANT Resistant     PIP/TAZO <=4 SENSITIVE Sensitive     * >=100,000 COLONIES/mL ESCHERICHIA COLI     Studies: No results found.  Scheduled Meds: . carvedilol  12.5 mg Oral BID WC  . cefTRIAXone (ROCEPHIN)  IV  1 g Intravenous Q24H  . clopidogrel  75 mg Oral QPM  . donepezil  5 mg Oral QHS  . heparin  5,000 Units Subcutaneous 3 times per  day  . insulin aspart  0-9 Units Subcutaneous TID WC  . levothyroxine  25 mcg Oral QAC breakfast  . pravastatin  80 mg Oral QHS  . senna-docusate  2 tablet Oral BID  . sodium chloride  3 mL Intravenous Q12H  . tamsulosin  0.4 mg Oral Daily    Continuous Infusions:     Time spent:  Xu,Fang MD, PhD  [facility] Pager [phone]. If 7PM-7AM, please contact night-coverage at [email or web address], password [facility] 05/25/2015, 5:19 PM  LOS: 4 days

## 2015-05-26 ENCOUNTER — Other Ambulatory Visit: Payer: Self-pay | Admitting: Neurology

## 2015-05-26 LAB — MAGNESIUM: MAGNESIUM: 2.2 mg/dL (ref 1.7–2.4)

## 2015-05-26 LAB — BASIC METABOLIC PANEL
ANION GAP: 6 (ref 5–15)
BUN: 17 mg/dL (ref 6–20)
CO2: 25 mmol/L (ref 22–32)
Calcium: 8.3 mg/dL — ABNORMAL LOW (ref 8.9–10.3)
Chloride: 108 mmol/L (ref 101–111)
Creatinine, Ser: 1.24 mg/dL (ref 0.61–1.24)
GFR calc Af Amer: >60 mL/min (ref >60)
GFR calc non Af Amer: 55 mL/min — ABNORMAL LOW (ref >60)
Glucose, Bld: 126 mg/dL — ABNORMAL HIGH (ref 65–99)
POTASSIUM: 3.8 mmol/L (ref 3.5–5.1)
SODIUM: 139 mmol/L (ref 135–145)

## 2015-05-26 LAB — GLUCOSE, CAPILLARY
GLUCOSE-CAPILLARY: 138 mg/dL — AB (ref 65–99)
Glucose-Capillary: 137 mg/dL — ABNORMAL HIGH (ref 65–99)

## 2015-05-26 LAB — CULTURE, BLOOD (ROUTINE X 2)
CULTURE: NO GROWTH
CULTURE: NO GROWTH

## 2015-05-26 MED ORDER — FUROSEMIDE 20 MG PO TABS: 20.0000 mg | ORAL_TABLET | Freq: Every day | ORAL | Status: AC | PRN

## 2015-05-26 MED ORDER — TAMSULOSIN HCL 0.4 MG PO CAPS: 0.4000 mg | ORAL_CAPSULE | Freq: Every day | ORAL | Status: AC

## 2015-05-26 MED ORDER — ENALAPRIL MALEATE 20 MG PO TABS: 10.0000 mg | ORAL_TABLET | Freq: Every day | ORAL | Status: AC

## 2015-05-26 MED ORDER — CIPROFLOXACIN HCL 500 MG PO TABS: 500.0000 mg | ORAL_TABLET | Freq: Two times a day (BID) | ORAL | Status: AC

## 2015-05-26 NOTE — Care Management Important Message (Signed)
Important Message  Patient Details  Name: Leroy Rodriguez MRN: [id number] Date of Birth: [birth year]   Medicare Important Message Given:  Yes-third notification given    Orson Aloe 05/26/2015, 3:30 PM

## 2015-05-26 NOTE — Discharge Summary (Signed)
Discharge Summary  Leroy Rodriguez WJX:[id number] DOB: 05-22-1941  PCP: Sissy Hoff, MD  Admit date: 05/21/2015 Discharge date: 05/26/2015  Time spent: <21mins  Recommendations for Outpatient Follow-up:  1. F/u with PMD within in a week, pmd to repeat bmp, monitor blood pressure control ( bp meds adjusted), pmd to refer patient to urology for recurrent UTI.  Discharge Diagnoses:  Active Hospital Problems   Diagnosis Date Noted  . UTI (lower urinary tract infection) 05/21/2015  . Diabetes mellitus without complication 05/21/2015  . Sepsis 05/21/2015  . Generalized weakness 05/21/2015  . AKI (acute kidney injury) 05/21/2015  . Vascular dementia 05/21/2015  . Stroke   . Hypothyroidism   . Hypertension   . Hyperlipidemia 12/18/2014  . Essential hypertension 12/18/2014  . Coronary artery disease 12/18/2014    Resolved Hospital Problems   Diagnosis Date Noted Date Resolved  No resolved problems to display.    Discharge Condition: stable  Diet recommendation: heart healthy/carb modified  Filed Weights   05/21/15 2125 05/24/15 2137 05/25/15 2130  Weight: 194 lb 8 oz (88.225 kg) 196 lb 13.9 oz (89.3 kg) 192 lb 14.4 oz (87.5 kg)    History of present illness:  Leroy Rodriguez is a 74 y.o. male with PMH of hypertension, hyperlipidemia, diabetes mellitus, hypothyroidism, vascular dementia, stroke, CAD, s/p of stent, who presents with generalized weakness.  The patient's daughter, patient has been using walker to walk around at home after he had a stroke. Today he becomes weaker and has a decreased oral intake since this AM. He feels so weak that he could not walk around using his walker anymore. He does not have new unilateral weakness, numbness or tingling sensations per his daughter. He has fever. He denies symptoms of UTI. No abdominal pain and diarrhea. He has very mild dry cough and very mild SOB which have resolved when he laying down in the bed. No chest pain or shortness  breast. Daughter reports that he was treated for UTI 3 weeks ago. After he completed keflex treatment, his bad urine smell has disappeared 3 weeks ago.  In ED, patient was found to have positive urinalysis for UTI, negative troponin, lactate 1.21, temperature 102.3, no tachycardia, WBC 21.6, Cre 1.47 (no previous creatinine on record). CXR showed cardiomegaly with low lung volumes, crowding of the bronchovascular markings and prominence of the superior vascular pedicle. Patient is admitted to inpatient for further evaluation and treatment.  Hospital Course:  Principal Problem:   UTI (lower urinary tract infection) Active Problems:   Coronary artery disease   Essential hypertension   Hyperlipidemia   Hypertension   Stroke   Hypothyroidism   Diabetes mellitus without complication   Sepsis   Generalized weakness   AKI (acute kidney injury)   Vascular dementia  Sepsis with fever 102.3, leukocytosis, uti, worsening of renal function on admission. Resolved with ivf and abx.  UTI:  reported recurrent uti, was just treated for uti a week ago, in the process of refer to urology by pmd.  Urine culture + ecoli and enterobacter,  Blood Culture negative, received IV rocephin in the hospital for 5 days, prescribed cipro bid for additional 2days to finish 7day treatment.  Renal US showed Debris is noted within the urinary bladder with multiple bladder diverticulum present. Enlarged prostate gland is noted. Started  flomax.  Acute renal insufficiency:  likely from uti, Home meds lasix and enalapril held in the hospital, cr normalized at time of discharge, lasix changed to prn basis for edema, enalapril dose  halfed.  Sob: initial complaint on admission, from sepsis? Does has dry cough, cxr cardiomegaly with low lung volume. Echo grade I diastolic dysfunction, adequate LVEF, close monitor volume status. Swallow eval low risk of aspiration. Incentive spirometer, resolved. euvolemic at  discharge.  Edema: per hpi bilateral on presentation, more prominent left lower extremity, venous US preliminary no DVT., echo grade i diastolic dysfunction. D/c ivf. d/c norvasc(for side effect of Edema) Edema completely resolved at time of discharge. Lasix restarted at discharge but changed to prn for edema.  CAD s/p stent, stable denies chest pain, troponin negative . EKG no acute changes. Continue coreg/plavix.   H/o CVA, with gait instability, but no laterality, on plavix.  Vascular dementia: at baseline, pleasant , cooperative, oriented to person and place. Not to time.  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 Oral meds restarted at discharge.  HTN; meds adjusted, patient blood pressure well controlled with coreg only in the hospital. At discharge, bp meds adjusted, stopped norvasc, decreased enalpril dose from  po qd to  po qd, changed lasxi to prn basis, instructed son (patient's primary care giver, to take blood pressure at home and bring in record to primary care follow up, pmd to continue adjust bp meds)  Code Status: full  Family Communication: patient, son   Disposition Plan: home on 9/26 with pmd and  urology follow up   Consultants:  none  Procedures:  none  Antibiotics:  Rocephin x5 days   Discharge Exam: BP 140/80 mmHg  Pulse 59  Temp(Src) 98.2 F (36.8 C) (Oral)  Resp 18  Ht  (1.702 m)  Wt 192 lb 14.4 oz (87.5 kg)  BMI 30.21 kg/m2  SpO2 96%   General: NAD, pleasantly demented  Cardiovascular: RRR  Respiratory: CTABL  Abdomen: Soft/ND/NT, positive BS  Musculoskeletal:  1+pitting Edema left lower extremity completely resolved at discharge  Neuro: baseline dementia, not oriented to time, no focal deficit. Very poor short term memory.   Discharge Instructions You were cared for by a hospitalist during your hospital stay. If you have any questions about your discharge medications or the care you received while you were  in the hospital after you are discharged, you can call the unit and asked to speak with the hospitalist on call if the hospitalist that took care of you is not available. Once you are discharged, your primary care physician will handle any further medical issues. Please note that NO REFILLS for any discharge medications will be authorized once you are discharged, as it is imperative that you return to your primary care physician (or establish a relationship with a primary care physician if you do not have one) for your aftercare needs so that they can reassess your need for medications and monitor your lab values.     Medication List    STOP taking these medications        amLODipine 10 MG tablet  Commonly known as:  NORVASC      TAKE these medications        carvedilol 12.5 MG tablet  Commonly known as:  COREG  Take 1 tablet by mouth 2 (two) times daily with a meal.     ciprofloxacin 500 MG tablet  Commonly known as:  CIPRO  Take 1 tablet (500 mg total) by mouth 2 (two) times daily.     clopidogrel 75 MG tablet  Commonly known as:  PLAVIX  Take 1 tablet by mouth every evening.     donepezil  5 MG tablet  Commonly known as:  ARICEPT  TAKE 1 TABLET(5 MG) BY MOUTH AT BEDTIME     enalapril 20 MG tablet  Commonly known as:  VASOTEC  Take 0.5 tablets (10 mg total) by mouth daily.     furosemide 20 MG tablet  Commonly known as:  LASIX  Take 1 tablet (20 mg total) by mouth daily as needed for fluid or edema.     glimepiride 2 MG tablet  Commonly known as:  AMARYL  Take 1 tablet by mouth daily.     levothyroxine 25 MCG tablet  Commonly known as:  SYNTHROID, LEVOTHROID  Take 1 tablet by mouth daily.     potassium chloride 10 MEQ CR capsule  Commonly known as:  MICRO-K  Take 1 capsule by mouth daily.     pravastatin 80 MG tablet  Commonly known as:  PRAVACHOL  Take 80 mg by mouth at bedtime.     tamsulosin 0.4 MG Caps capsule  Commonly known as:  FLOMAX  Take 1 capsule  (0.4 mg total) by mouth daily.       No Known Allergies Follow-up Information    Follow up with Sissy Hoff, MD In 1 week.   Specialty:  Family Medicine   Why:  hospital discharge follow up, repeat bmp, check blood pressure, pmd to refer patient to urology    Contact information:   [address] [place] [address] [phone]        The results of significant diagnostics from this hospitalization (including imaging, microbiology, ancillary and laboratory) are listed below for reference.    Significant Diagnostic Studies: Dg Chest 2 View  05/21/2015   CLINICAL DATA:  Shortness of breath, weakness  EXAM: CHEST  2 VIEW  COMPARISON:  No similar prior exam is available at this institution for comparison or on [employer].  FINDINGS: Mild enlargement of the cardiomediastinal silhouette is noted. The aorta is unfolded and ectatic. Presumed prominence of the vascular pedicle noted over the right paratracheal region. Lungs are hypoaerated with crowding of the bronchovascular markings. No pleural effusion. No acute osseous abnormality.  IMPRESSION: Cardiomegaly with low lung volumes and crowding of the bronchovascular markings.  Prominence of the superior vascular pedicle.   Electronically Signed   By: Christiana Pellant M.D.   On: 05/21/2015 18:40   US Renal  05/21/2015   CLINICAL DATA:  Acute kidney injury.  EXAM: RENAL / URINARY TRACT ULTRASOUND COMPLETE  COMPARISON:  None.  FINDINGS: Right Kidney:  Length: 11.7 cm. 1.5 cm cyst is seen in upper pole. 1.9 cm cyst is seen in midpole. Echogenicity within normal limits. No mass or hydronephrosis visualized.  Left Kidney:  Length: 9.7 cm. Lobulated cyst measuring 7.3 x 5.2 x 0.5 cm is noted arising from lower pole. Echogenicity within normal limits. No mass or hydronephrosis visualized.  Bladder:  Debris is noted in the dependent portion of the bladder with multiple bladder diverticula present. Enlarged prostate gland is noted.   IMPRESSION: Bilateral renal cysts. No hydronephrosis or renal obstruction is noted. Debris is noted within the urinary bladder with multiple bladder diverticulum present. Enlarged prostate gland is noted.   Electronically Signed   By: Lupita Raider, M.D.   On: 05/21/2015 20:51    Microbiology: Recent Results (from the past 240 hour(s))  Culture, blood (routine x 2)     Status: None (Preliminary result)   Collection Time: 05/21/15  5:50 PM  Result Value Ref Range Status  Specimen Description BLOOD RIGHT ARM  Final   Special Requests BOTTLES DRAWN AEROBIC AND ANAEROBIC  Final   Culture NO GROWTH 4 DAYS  Final   Report Status PENDING  Incomplete  Culture, blood (routine x 2)     Status: None (Preliminary result)   Collection Time: 05/21/15  6:00 PM  Result Value Ref Range Status   Specimen Description BLOOD LEFT HAND  Final   Special Requests BOTTLES DRAWN AEROBIC AND ANAEROBIC  Final   Culture NO GROWTH 4 DAYS  Final   Report Status PENDING  Incomplete  Urine culture     Status: None   Collection Time: 05/21/15  6:15 PM  Result Value Ref Range Status   Specimen Description URINE, CLEAN CATCH  Final   Special Requests NONE  Final   Culture   Final    >=100,000 COLONIES/mL ESCHERICHIA COLI >=100,000 COLONIES/mL ENTEROBACTER CLOACAE    Report Status 05/25/2015 FINAL  Final   Organism ID, Bacteria ESCHERICHIA COLI  Final   Organism ID, Bacteria ENTEROBACTER CLOACAE  Final      Susceptibility   Enterobacter cloacae - MIC*    CEFAZOLIN >=64 RESISTANT Resistant     CEFTRIAXONE <=1 SENSITIVE Sensitive     CIPROFLOXACIN <=0.25 SENSITIVE Sensitive     GENTAMICIN <=1 SENSITIVE Sensitive     IMIPENEM <=0.25 SENSITIVE Sensitive     NITROFURANTOIN 32 SENSITIVE Sensitive     TRIMETH/SULFA <=20 SENSITIVE Sensitive     PIP/TAZO <=4 SENSITIVE Sensitive     * >=100,000 COLONIES/mL ENTEROBACTER CLOACAE   Escherichia coli - MIC*    AMPICILLIN >=32 RESISTANT Resistant     CEFAZOLIN <=4  SENSITIVE Sensitive     CEFTRIAXONE <=1 SENSITIVE Sensitive     CIPROFLOXACIN <=0.25 SENSITIVE Sensitive     GENTAMICIN <=1 SENSITIVE Sensitive     IMIPENEM <=0.25 SENSITIVE Sensitive     NITROFURANTOIN <=16 SENSITIVE Sensitive     TRIMETH/SULFA <=20 SENSITIVE Sensitive     AMPICILLIN/SULBACTAM >=32 RESISTANT Resistant     PIP/TAZO <=4 SENSITIVE Sensitive     * >=100,000 COLONIES/mL ESCHERICHIA COLI     Labs: Basic Metabolic Panel:  Recent Labs Lab 05/22/15 0510 05/23/15 0503 05/24/15 0446 05/25/15 0530 05/26/15 0358  NA 139 135 138 139 139  K 3.7 3.6 3.9 3.6 3.8  CL 110 107 107 106 108  CO2 23 23 21* 26 25  GLUCOSE 112* 113* 120* 118* 126*  BUN 20 11 15 12 17   CREATININE 1.23 1.29* 1.39* 1.29* 1.24  CALCIUM 8.5* 8.2* 8.6* 8.4* 8.3*  MG  --  2.1 2.1  --  2.2   Liver Function Tests:  Recent Labs Lab 05/21/15 1750  AST 36  ALT 18  ALKPHOS 70  BILITOT 0.8  PROT 7.2  ALBUMIN 3.8   No results for input(s): LIPASE, AMYLASE in the last 168 hours. No results for input(s): AMMONIA in the last 168 hours. CBC:  Recent Labs Lab 05/21/15 1750 05/22/15 0510 05/23/15 0503 05/24/15 0446  WBC 21.6* 20.7* 12.9* 8.1  NEUTROABS 18.2*  --  10.3*  --   HGB 15.1 12.7* 13.2 13.1  HCT 43.8 38.3* 38.7* 39.2  MCV 88.5 87.8 88.6 88.3  PLT 282 248 213 251   Cardiac Enzymes: No results for input(s): CKTOTAL, CKMB, CKMBINDEX, TROPONINI in the last 168 hours. BNP: BNP (last 3 results)  Recent Labs  05/22/15 0510  BNP 76.6    ProBNP (last 3 results) No results for input(s): PROBNP in  the last 8760 hours.  CBG:  Recent Labs Lab 05/25/15 1216 05/25/15 1719 05/25/15 2129 05/26/15 0734 05/26/15 1117  GLUCAP 133* 119* 181* 137* 138*       Signed:  Xu,Fang MD, PhD  [facility] 05/26/2015, 12:03 PM

## 2015-06-02 DIAGNOSIS — I10 Essential (primary) hypertension: Secondary | ICD-10-CM | POA: Diagnosis not present

## 2015-06-02 DIAGNOSIS — E039 Hypothyroidism, unspecified: Secondary | ICD-10-CM | POA: Diagnosis not present

## 2015-06-02 DIAGNOSIS — E119 Type 2 diabetes mellitus without complications: Secondary | ICD-10-CM | POA: Diagnosis not present

## 2015-06-02 DIAGNOSIS — A419 Sepsis, unspecified organism: Secondary | ICD-10-CM | POA: Diagnosis not present

## 2015-06-02 DIAGNOSIS — E785 Hyperlipidemia, unspecified: Secondary | ICD-10-CM | POA: Diagnosis not present

## 2015-06-02 DIAGNOSIS — N390 Urinary tract infection, site not specified: Secondary | ICD-10-CM | POA: Diagnosis not present

## 2015-06-02 DIAGNOSIS — R944 Abnormal results of kidney function studies: Secondary | ICD-10-CM | POA: Diagnosis not present

## 2015-06-02 DIAGNOSIS — I2510 Atherosclerotic heart disease of native coronary artery without angina pectoris: Secondary | ICD-10-CM | POA: Diagnosis not present

## 2015-06-03 ENCOUNTER — Ambulatory Visit: Payer: Medicare Other | Admitting: Neurology

## 2015-06-05 DIAGNOSIS — R351 Nocturia: Secondary | ICD-10-CM | POA: Diagnosis not present

## 2015-06-05 DIAGNOSIS — R339 Retention of urine, unspecified: Secondary | ICD-10-CM | POA: Diagnosis not present

## 2015-06-05 DIAGNOSIS — N1330 Unspecified hydronephrosis: Secondary | ICD-10-CM | POA: Diagnosis not present

## 2015-06-05 DIAGNOSIS — N3020 Other chronic cystitis without hematuria: Secondary | ICD-10-CM | POA: Diagnosis not present

## 2015-06-11 DIAGNOSIS — N400 Enlarged prostate without lower urinary tract symptoms: Secondary | ICD-10-CM | POA: Diagnosis not present

## 2015-06-11 DIAGNOSIS — R531 Weakness: Secondary | ICD-10-CM | POA: Diagnosis not present

## 2015-06-11 DIAGNOSIS — Z8673 Personal history of transient ischemic attack (TIA), and cerebral infarction without residual deficits: Secondary | ICD-10-CM | POA: Diagnosis not present

## 2015-06-11 DIAGNOSIS — E785 Hyperlipidemia, unspecified: Secondary | ICD-10-CM | POA: Diagnosis not present

## 2015-06-11 DIAGNOSIS — R609 Edema, unspecified: Secondary | ICD-10-CM | POA: Diagnosis not present

## 2015-06-11 DIAGNOSIS — I2510 Atherosclerotic heart disease of native coronary artery without angina pectoris: Secondary | ICD-10-CM | POA: Diagnosis not present

## 2015-06-11 DIAGNOSIS — E119 Type 2 diabetes mellitus without complications: Secondary | ICD-10-CM | POA: Diagnosis not present

## 2015-06-11 DIAGNOSIS — Z87440 Personal history of urinary (tract) infections: Secondary | ICD-10-CM | POA: Diagnosis not present

## 2015-06-11 DIAGNOSIS — E039 Hypothyroidism, unspecified: Secondary | ICD-10-CM | POA: Diagnosis not present

## 2015-06-11 DIAGNOSIS — F0150 Vascular dementia without behavioral disturbance: Secondary | ICD-10-CM | POA: Diagnosis not present

## 2015-06-12 ENCOUNTER — Encounter: Payer: Self-pay | Admitting: Neurology

## 2015-06-12 ENCOUNTER — Telehealth: Payer: Self-pay

## 2015-06-12 ENCOUNTER — Ambulatory Visit: Payer: Medicare Other | Admitting: Neurology

## 2015-06-12 NOTE — Telephone Encounter (Signed)
Patient no-showed to appt.

## 2015-06-13 DIAGNOSIS — N1330 Unspecified hydronephrosis: Secondary | ICD-10-CM | POA: Diagnosis not present

## 2015-06-13 DIAGNOSIS — R3129 Other microscopic hematuria: Secondary | ICD-10-CM | POA: Diagnosis not present

## 2015-06-16 DIAGNOSIS — N3020 Other chronic cystitis without hematuria: Secondary | ICD-10-CM | POA: Diagnosis not present

## 2015-06-16 DIAGNOSIS — R3914 Feeling of incomplete bladder emptying: Secondary | ICD-10-CM | POA: Diagnosis not present

## 2015-06-17 ENCOUNTER — Other Ambulatory Visit: Payer: Self-pay | Admitting: Neurology

## 2015-06-17 DIAGNOSIS — R609 Edema, unspecified: Secondary | ICD-10-CM | POA: Diagnosis not present

## 2015-06-17 DIAGNOSIS — R531 Weakness: Secondary | ICD-10-CM | POA: Diagnosis not present

## 2015-06-17 DIAGNOSIS — Z87440 Personal history of urinary (tract) infections: Secondary | ICD-10-CM | POA: Diagnosis not present

## 2015-06-17 DIAGNOSIS — I2510 Atherosclerotic heart disease of native coronary artery without angina pectoris: Secondary | ICD-10-CM | POA: Diagnosis not present

## 2015-06-17 DIAGNOSIS — F0150 Vascular dementia without behavioral disturbance: Secondary | ICD-10-CM | POA: Diagnosis not present

## 2015-06-17 DIAGNOSIS — E119 Type 2 diabetes mellitus without complications: Secondary | ICD-10-CM | POA: Diagnosis not present

## 2015-06-19 DIAGNOSIS — R609 Edema, unspecified: Secondary | ICD-10-CM | POA: Diagnosis not present

## 2015-06-19 DIAGNOSIS — E119 Type 2 diabetes mellitus without complications: Secondary | ICD-10-CM | POA: Diagnosis not present

## 2015-06-19 DIAGNOSIS — Z87440 Personal history of urinary (tract) infections: Secondary | ICD-10-CM | POA: Diagnosis not present

## 2015-06-19 DIAGNOSIS — I2510 Atherosclerotic heart disease of native coronary artery without angina pectoris: Secondary | ICD-10-CM | POA: Diagnosis not present

## 2015-06-19 DIAGNOSIS — R531 Weakness: Secondary | ICD-10-CM | POA: Diagnosis not present

## 2015-06-19 DIAGNOSIS — F0150 Vascular dementia without behavioral disturbance: Secondary | ICD-10-CM | POA: Diagnosis not present

## 2015-06-24 DIAGNOSIS — F0150 Vascular dementia without behavioral disturbance: Secondary | ICD-10-CM | POA: Diagnosis not present

## 2015-06-24 DIAGNOSIS — Z87440 Personal history of urinary (tract) infections: Secondary | ICD-10-CM | POA: Diagnosis not present

## 2015-06-24 DIAGNOSIS — E119 Type 2 diabetes mellitus without complications: Secondary | ICD-10-CM | POA: Diagnosis not present

## 2015-06-24 DIAGNOSIS — I2510 Atherosclerotic heart disease of native coronary artery without angina pectoris: Secondary | ICD-10-CM | POA: Diagnosis not present

## 2015-06-24 DIAGNOSIS — R531 Weakness: Secondary | ICD-10-CM | POA: Diagnosis not present

## 2015-06-24 DIAGNOSIS — R609 Edema, unspecified: Secondary | ICD-10-CM | POA: Diagnosis not present

## 2015-06-26 DIAGNOSIS — I2510 Atherosclerotic heart disease of native coronary artery without angina pectoris: Secondary | ICD-10-CM | POA: Diagnosis not present

## 2015-06-26 DIAGNOSIS — Z87440 Personal history of urinary (tract) infections: Secondary | ICD-10-CM | POA: Diagnosis not present

## 2015-06-26 DIAGNOSIS — E119 Type 2 diabetes mellitus without complications: Secondary | ICD-10-CM | POA: Diagnosis not present

## 2015-06-26 DIAGNOSIS — R531 Weakness: Secondary | ICD-10-CM | POA: Diagnosis not present

## 2015-06-26 DIAGNOSIS — F0150 Vascular dementia without behavioral disturbance: Secondary | ICD-10-CM | POA: Diagnosis not present

## 2015-06-26 DIAGNOSIS — R609 Edema, unspecified: Secondary | ICD-10-CM | POA: Diagnosis not present

## 2015-06-28 ENCOUNTER — Other Ambulatory Visit: Payer: Self-pay | Admitting: Neurology

## 2015-07-01 DIAGNOSIS — F0150 Vascular dementia without behavioral disturbance: Secondary | ICD-10-CM | POA: Diagnosis not present

## 2015-07-01 DIAGNOSIS — E119 Type 2 diabetes mellitus without complications: Secondary | ICD-10-CM | POA: Diagnosis not present

## 2015-07-01 DIAGNOSIS — Z87440 Personal history of urinary (tract) infections: Secondary | ICD-10-CM | POA: Diagnosis not present

## 2015-07-01 DIAGNOSIS — R531 Weakness: Secondary | ICD-10-CM | POA: Diagnosis not present

## 2015-07-01 DIAGNOSIS — R609 Edema, unspecified: Secondary | ICD-10-CM | POA: Diagnosis not present

## 2015-07-01 DIAGNOSIS — I2510 Atherosclerotic heart disease of native coronary artery without angina pectoris: Secondary | ICD-10-CM | POA: Diagnosis not present

## 2015-07-02 ENCOUNTER — Other Ambulatory Visit: Payer: Self-pay | Admitting: Neurology

## 2015-07-03 DIAGNOSIS — R609 Edema, unspecified: Secondary | ICD-10-CM | POA: Diagnosis not present

## 2015-07-03 DIAGNOSIS — E119 Type 2 diabetes mellitus without complications: Secondary | ICD-10-CM | POA: Diagnosis not present

## 2015-07-03 DIAGNOSIS — R531 Weakness: Secondary | ICD-10-CM | POA: Diagnosis not present

## 2015-07-03 DIAGNOSIS — I2510 Atherosclerotic heart disease of native coronary artery without angina pectoris: Secondary | ICD-10-CM | POA: Diagnosis not present

## 2015-07-03 DIAGNOSIS — Z87440 Personal history of urinary (tract) infections: Secondary | ICD-10-CM | POA: Diagnosis not present

## 2015-07-03 DIAGNOSIS — F0150 Vascular dementia without behavioral disturbance: Secondary | ICD-10-CM | POA: Diagnosis not present

## 2015-07-09 DIAGNOSIS — Z87440 Personal history of urinary (tract) infections: Secondary | ICD-10-CM | POA: Diagnosis not present

## 2015-07-09 DIAGNOSIS — E119 Type 2 diabetes mellitus without complications: Secondary | ICD-10-CM | POA: Diagnosis not present

## 2015-07-09 DIAGNOSIS — R531 Weakness: Secondary | ICD-10-CM | POA: Diagnosis not present

## 2015-07-09 DIAGNOSIS — R609 Edema, unspecified: Secondary | ICD-10-CM | POA: Diagnosis not present

## 2015-07-09 DIAGNOSIS — I2510 Atherosclerotic heart disease of native coronary artery without angina pectoris: Secondary | ICD-10-CM | POA: Diagnosis not present

## 2015-07-09 DIAGNOSIS — F0150 Vascular dementia without behavioral disturbance: Secondary | ICD-10-CM | POA: Diagnosis not present

## 2015-07-10 DIAGNOSIS — R531 Weakness: Secondary | ICD-10-CM | POA: Diagnosis not present

## 2015-07-10 DIAGNOSIS — R609 Edema, unspecified: Secondary | ICD-10-CM | POA: Diagnosis not present

## 2015-07-10 DIAGNOSIS — E119 Type 2 diabetes mellitus without complications: Secondary | ICD-10-CM | POA: Diagnosis not present

## 2015-07-10 DIAGNOSIS — F0150 Vascular dementia without behavioral disturbance: Secondary | ICD-10-CM | POA: Diagnosis not present

## 2015-07-10 DIAGNOSIS — Z87440 Personal history of urinary (tract) infections: Secondary | ICD-10-CM | POA: Diagnosis not present

## 2015-07-10 DIAGNOSIS — I2510 Atherosclerotic heart disease of native coronary artery without angina pectoris: Secondary | ICD-10-CM | POA: Diagnosis not present

## 2015-07-21 ENCOUNTER — Other Ambulatory Visit: Payer: Self-pay | Admitting: Neurology

## 2015-07-22 ENCOUNTER — Telehealth: Payer: Self-pay

## 2015-07-22 DIAGNOSIS — E119 Type 2 diabetes mellitus without complications: Secondary | ICD-10-CM | POA: Diagnosis not present

## 2015-07-22 DIAGNOSIS — I10 Essential (primary) hypertension: Secondary | ICD-10-CM | POA: Diagnosis not present

## 2015-07-22 DIAGNOSIS — E039 Hypothyroidism, unspecified: Secondary | ICD-10-CM | POA: Diagnosis not present

## 2015-07-22 DIAGNOSIS — R609 Edema, unspecified: Secondary | ICD-10-CM | POA: Diagnosis not present

## 2015-07-22 DIAGNOSIS — I2510 Atherosclerotic heart disease of native coronary artery without angina pectoris: Secondary | ICD-10-CM | POA: Diagnosis not present

## 2015-07-22 DIAGNOSIS — R938 Abnormal findings on diagnostic imaging of other specified body structures: Secondary | ICD-10-CM | POA: Diagnosis not present

## 2015-07-22 DIAGNOSIS — F0150 Vascular dementia without behavioral disturbance: Secondary | ICD-10-CM | POA: Diagnosis not present

## 2015-07-22 DIAGNOSIS — E785 Hyperlipidemia, unspecified: Secondary | ICD-10-CM | POA: Diagnosis not present

## 2015-07-22 NOTE — Telephone Encounter (Signed)
Patient has rescheduled appt  

## 2015-07-22 NOTE — Telephone Encounter (Signed)
I spoke to daughter and rescheduled patient's appt that was missed in October to first available for patient. Appt set in January.

## 2015-08-26 DIAGNOSIS — R933 Abnormal findings on diagnostic imaging of other parts of digestive tract: Secondary | ICD-10-CM | POA: Diagnosis not present

## 2015-08-28 ENCOUNTER — Telehealth: Payer: Self-pay

## 2015-08-28 NOTE — Telephone Encounter (Signed)
Requesting surgical clearance:   1. Type of surgery: colonoscopy  2. Surgeon: Dr. Evette CristalGanem  3. Surgical date: 09/26/2015  4. Medications that need to be held: Plavix  5. CAD: yes     6. I will defer to: Burnett HarryBerry   [facility] - GI [phone] Fax [phone] phone

## 2015-08-29 NOTE — Telephone Encounter (Signed)
OK to interrupt anti-platelet Rx for colonoscopy 

## 2015-08-29 NOTE — Telephone Encounter (Signed)
Ok per Dr. Allyson SabalBerry to hold for colonoscopy, stents placed in 2010.  Recommend to hold for 7 days.

## 2015-08-29 NOTE — Telephone Encounter (Signed)
Pt clearance faxed to [facility].  

## 2015-09-23 ENCOUNTER — Ambulatory Visit: Payer: Self-pay | Admitting: Neurology

## 2015-10-01 ENCOUNTER — Telehealth: Payer: Self-pay

## 2015-10-01 ENCOUNTER — Ambulatory Visit: Payer: Medicare Other | Admitting: Neurology

## 2015-10-01 NOTE — Telephone Encounter (Signed)
Patient did not show to appt today  

## 2015-10-08 ENCOUNTER — Encounter: Payer: Self-pay | Admitting: Neurology

## 2016-06-01 IMAGING — DX DG CHEST 2V
2 series · 2 of 2 positions shown · non-contrast
Comparison: No similar prior exam is available at this institution
for comparison or on [HOSPITAL] PACS.

CLINICAL DATA: Shortness of breath, weakness

EXAM:
CHEST  2 VIEW

[w chest lat]
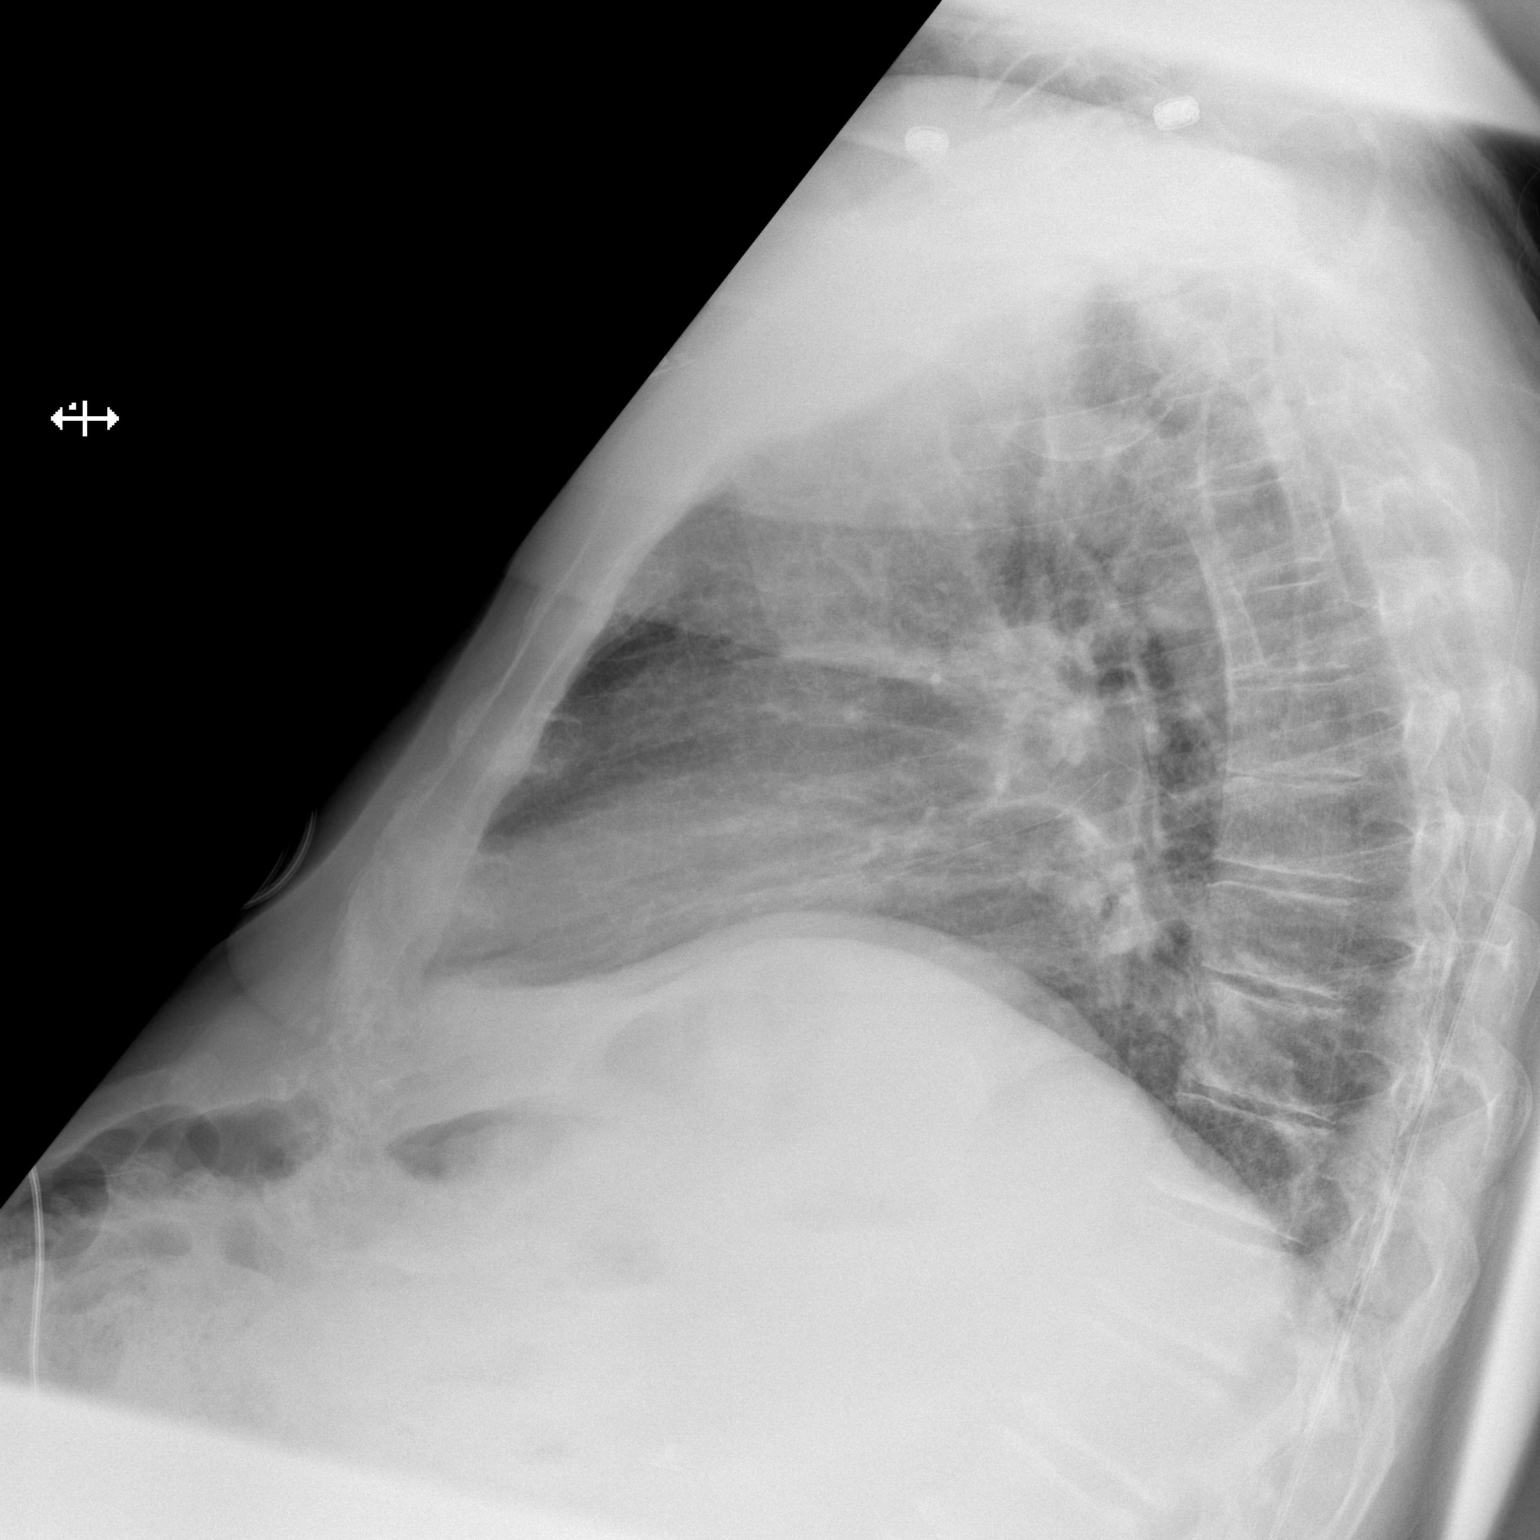

[x chest ap]
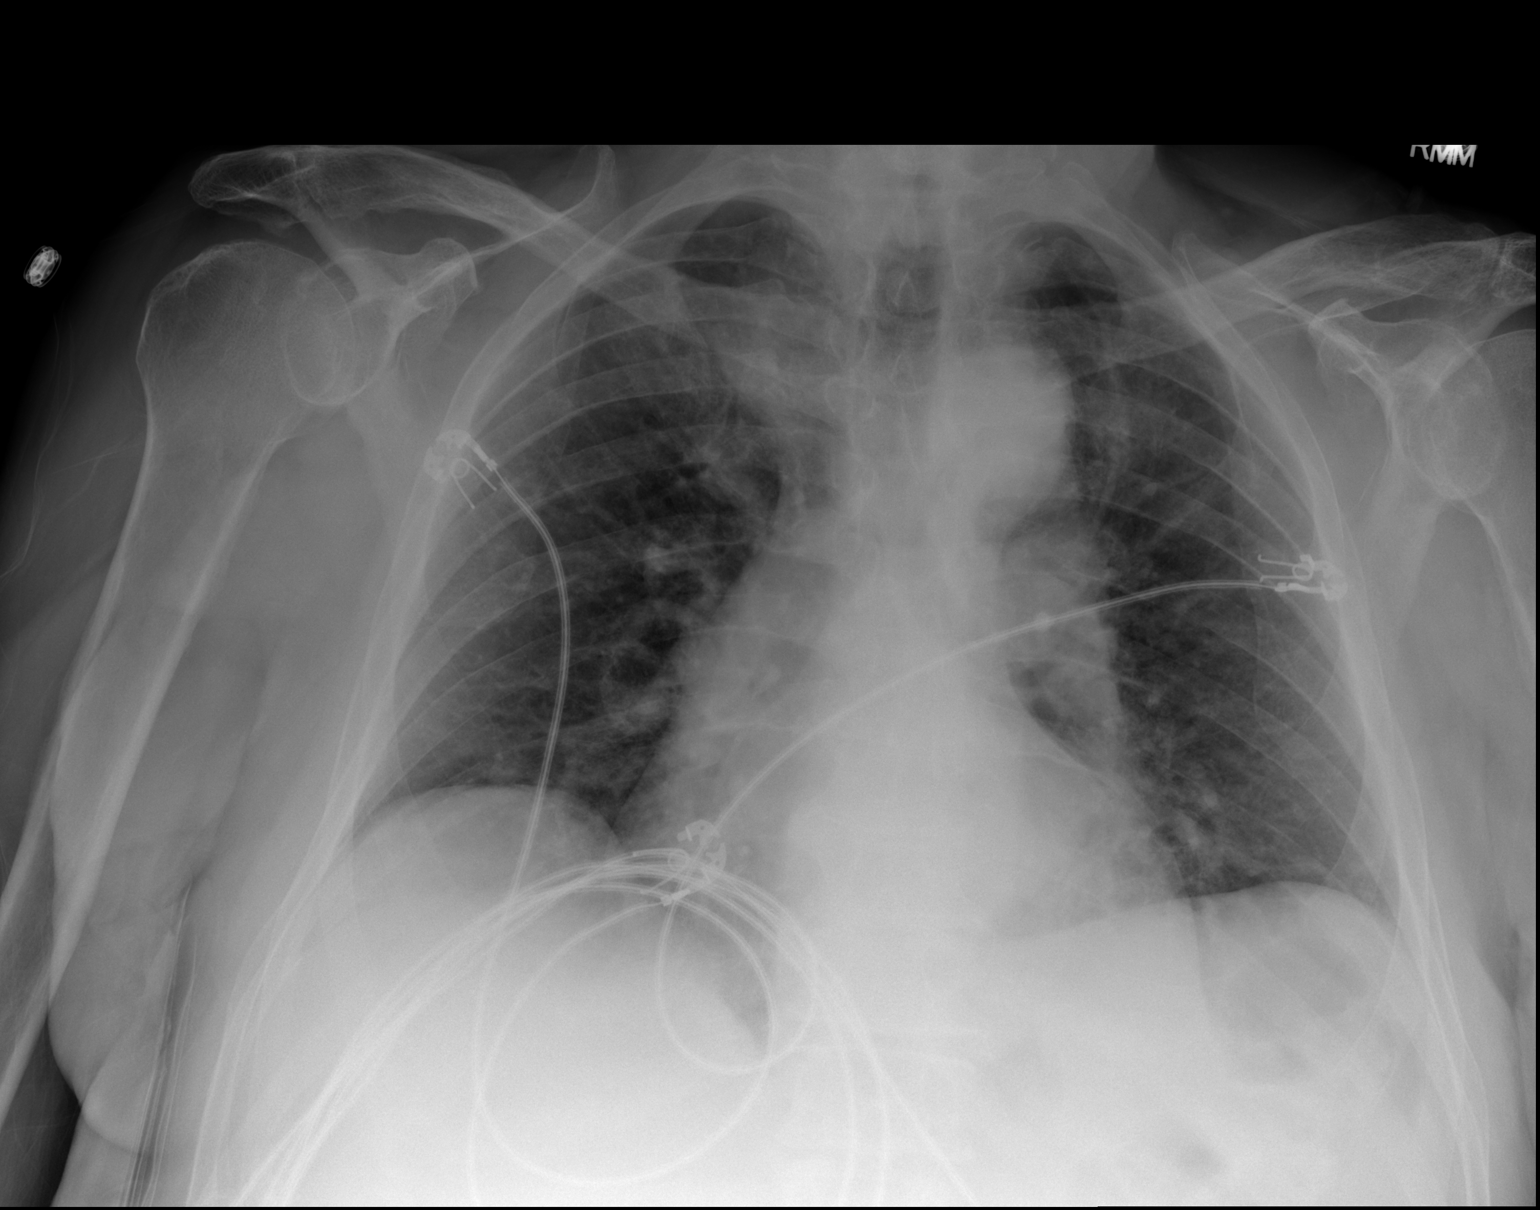

[2 of 2 positions shown; findings below may reference images not displayed]

FINDINGS: Mild enlargement of the cardiomediastinal silhouette is noted. The
aorta is unfolded and ectatic. Presumed prominence of the vascular
pedicle noted over the right paratracheal region. Lungs are
hypoaerated with crowding of the bronchovascular markings. No
pleural effusion. No acute osseous abnormality.
IMPRESSION: Cardiomegaly with low lung volumes and crowding of the
bronchovascular markings.

Prominence of the superior vascular pedicle.

## 2016-06-23 DIAGNOSIS — E039 Hypothyroidism, unspecified: Secondary | ICD-10-CM | POA: Diagnosis not present

## 2016-06-23 DIAGNOSIS — N390 Urinary tract infection, site not specified: Secondary | ICD-10-CM | POA: Diagnosis not present

## 2016-06-23 DIAGNOSIS — E119 Type 2 diabetes mellitus without complications: Secondary | ICD-10-CM | POA: Diagnosis not present

## 2016-06-23 DIAGNOSIS — I10 Essential (primary) hypertension: Secondary | ICD-10-CM | POA: Diagnosis not present

## 2016-06-24 DIAGNOSIS — I10 Essential (primary) hypertension: Secondary | ICD-10-CM | POA: Diagnosis not present

## 2016-06-24 DIAGNOSIS — E785 Hyperlipidemia, unspecified: Secondary | ICD-10-CM | POA: Diagnosis not present

## 2016-06-24 DIAGNOSIS — E039 Hypothyroidism, unspecified: Secondary | ICD-10-CM | POA: Diagnosis not present

## 2016-06-24 DIAGNOSIS — N390 Urinary tract infection, site not specified: Secondary | ICD-10-CM | POA: Diagnosis not present

## 2016-06-24 DIAGNOSIS — E559 Vitamin D deficiency, unspecified: Secondary | ICD-10-CM | POA: Diagnosis not present

## 2016-06-24 DIAGNOSIS — E119 Type 2 diabetes mellitus without complications: Secondary | ICD-10-CM | POA: Diagnosis not present

## 2016-07-01 DIAGNOSIS — E119 Type 2 diabetes mellitus without complications: Secondary | ICD-10-CM | POA: Diagnosis not present

## 2016-07-01 DIAGNOSIS — F0150 Vascular dementia without behavioral disturbance: Secondary | ICD-10-CM | POA: Diagnosis not present

## 2016-07-01 DIAGNOSIS — E039 Hypothyroidism, unspecified: Secondary | ICD-10-CM | POA: Diagnosis not present

## 2016-07-01 DIAGNOSIS — E785 Hyperlipidemia, unspecified: Secondary | ICD-10-CM | POA: Diagnosis not present

## 2016-07-01 DIAGNOSIS — N390 Urinary tract infection, site not specified: Secondary | ICD-10-CM | POA: Diagnosis not present

## 2016-07-01 DIAGNOSIS — I10 Essential (primary) hypertension: Secondary | ICD-10-CM | POA: Diagnosis not present

## 2016-07-06 DIAGNOSIS — E119 Type 2 diabetes mellitus without complications: Secondary | ICD-10-CM | POA: Diagnosis not present

## 2016-07-06 DIAGNOSIS — E039 Hypothyroidism, unspecified: Secondary | ICD-10-CM | POA: Diagnosis not present

## 2016-07-06 DIAGNOSIS — E785 Hyperlipidemia, unspecified: Secondary | ICD-10-CM | POA: Diagnosis not present

## 2016-07-06 DIAGNOSIS — N390 Urinary tract infection, site not specified: Secondary | ICD-10-CM | POA: Diagnosis not present

## 2016-07-06 DIAGNOSIS — I10 Essential (primary) hypertension: Secondary | ICD-10-CM | POA: Diagnosis not present

## 2016-07-06 DIAGNOSIS — F0150 Vascular dementia without behavioral disturbance: Secondary | ICD-10-CM | POA: Diagnosis not present

## 2016-07-08 DIAGNOSIS — E119 Type 2 diabetes mellitus without complications: Secondary | ICD-10-CM | POA: Diagnosis not present

## 2016-07-08 DIAGNOSIS — F0150 Vascular dementia without behavioral disturbance: Secondary | ICD-10-CM | POA: Diagnosis not present

## 2016-07-08 DIAGNOSIS — N390 Urinary tract infection, site not specified: Secondary | ICD-10-CM | POA: Diagnosis not present

## 2016-07-08 DIAGNOSIS — E039 Hypothyroidism, unspecified: Secondary | ICD-10-CM | POA: Diagnosis not present

## 2016-07-08 DIAGNOSIS — I10 Essential (primary) hypertension: Secondary | ICD-10-CM | POA: Diagnosis not present

## 2016-07-08 DIAGNOSIS — E785 Hyperlipidemia, unspecified: Secondary | ICD-10-CM | POA: Diagnosis not present

## 2016-07-10 DIAGNOSIS — E781 Pure hyperglyceridemia: Secondary | ICD-10-CM | POA: Diagnosis not present

## 2016-07-10 DIAGNOSIS — E119 Type 2 diabetes mellitus without complications: Secondary | ICD-10-CM | POA: Diagnosis not present

## 2016-07-10 DIAGNOSIS — E559 Vitamin D deficiency, unspecified: Secondary | ICD-10-CM | POA: Diagnosis not present

## 2016-07-10 IMAGING — US US RENAL
1 series · 14 of 25 positions shown · non-contrast
Comparison: None.

CLINICAL DATA: Acute kidney injury.

EXAM:
RENAL / URINARY TRACT ULTRASOUND COMPLETE

[Series 1: us renal · 0.23mm/px · 14 of 50 slices shown]
[im 1/50]
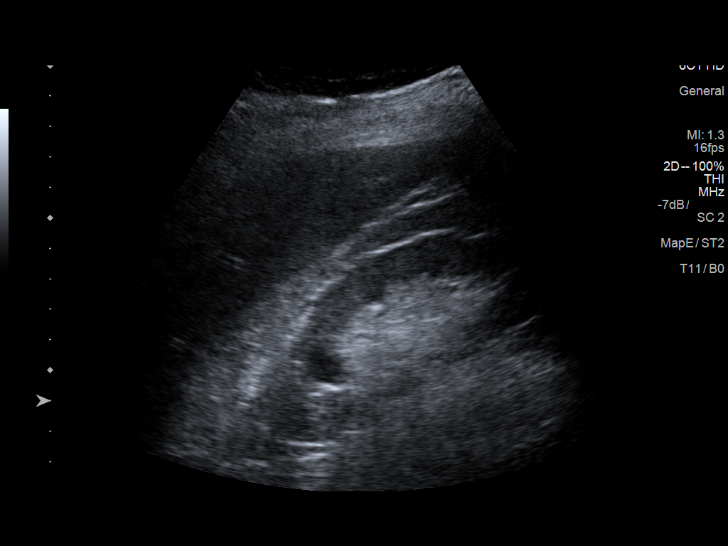
[im 5/50]
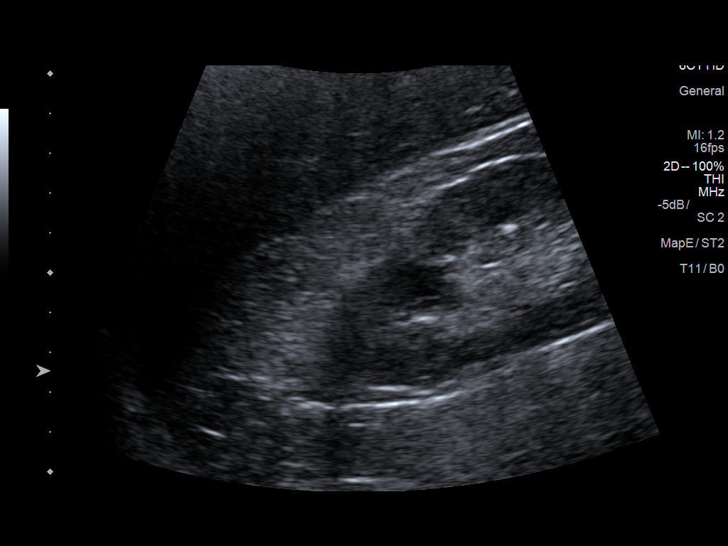
[im 9/50]
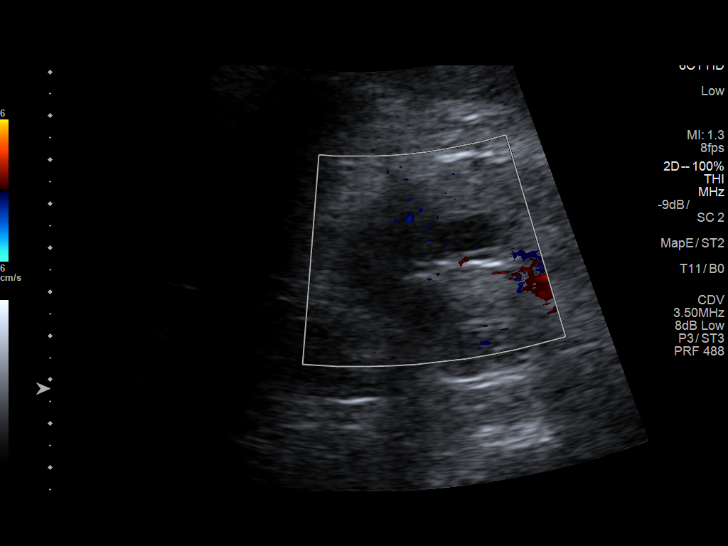
[im 13/50]
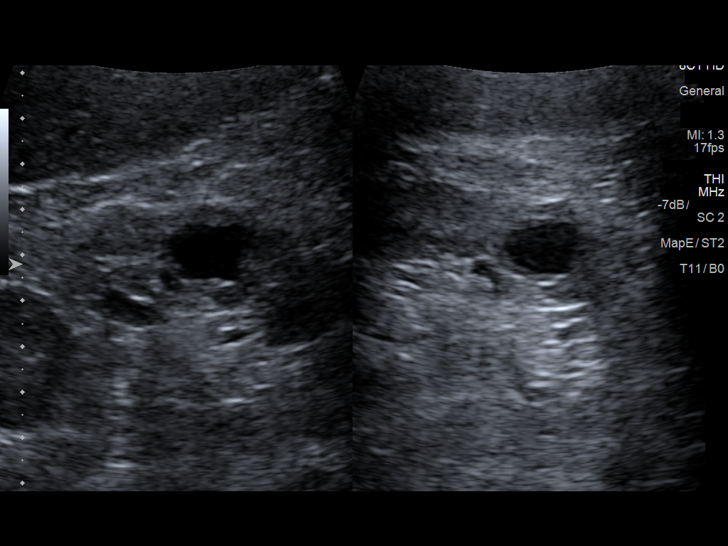
[im 17/50]
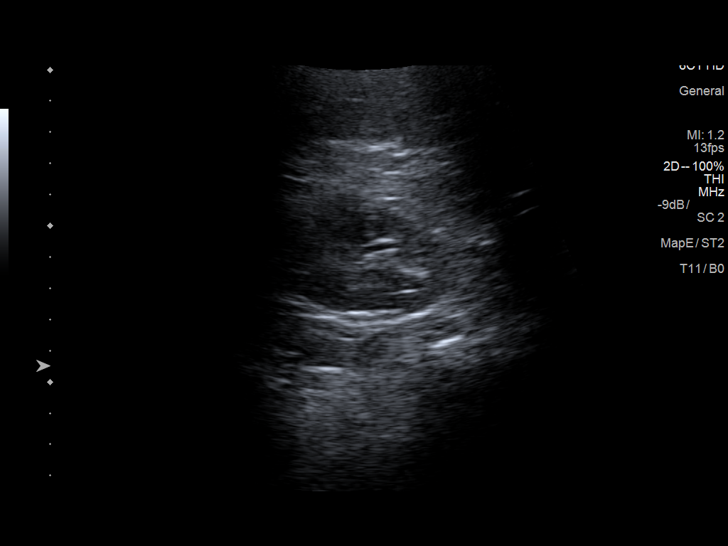
[im 19/50]
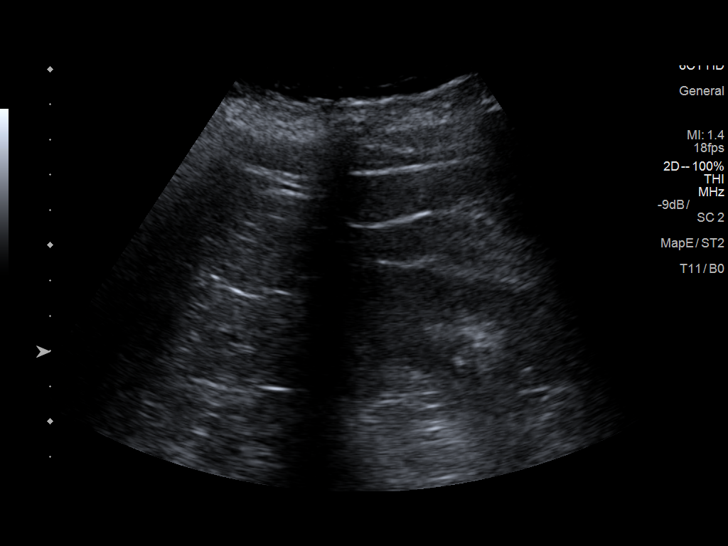
[im 23/50]
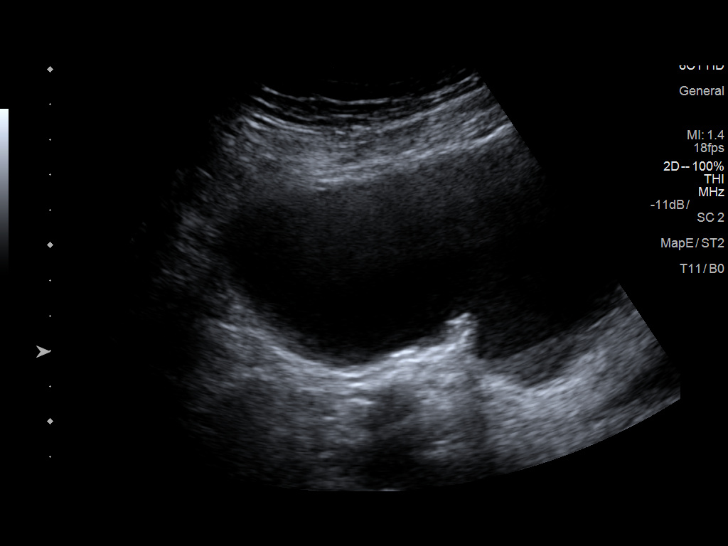
[im 27/50]
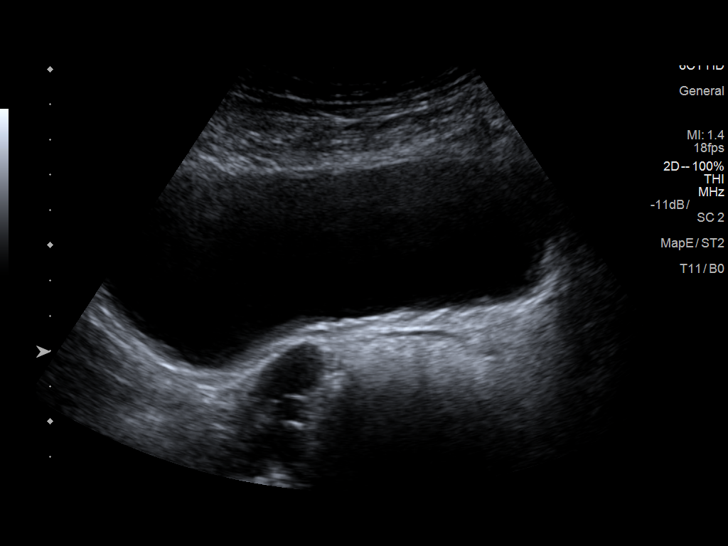
[im 31/50]
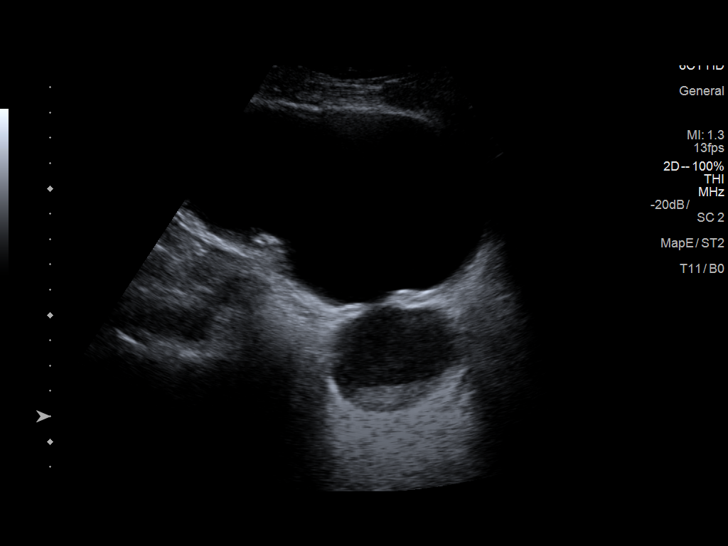
[im 33/50]
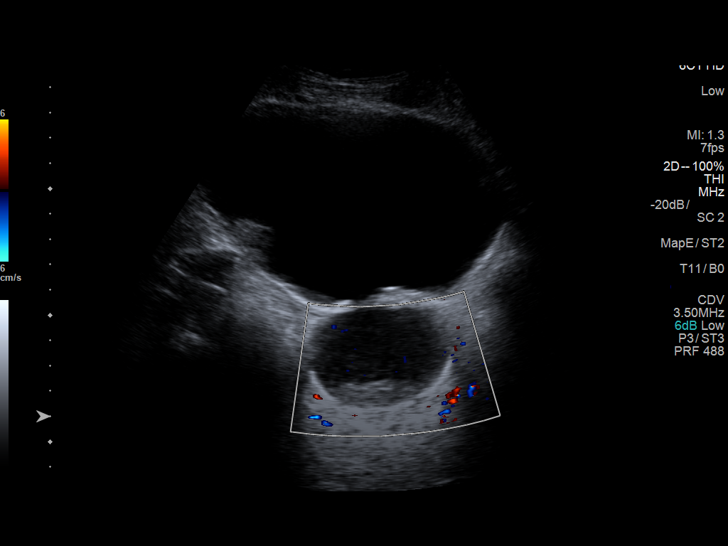
[im 37/50]
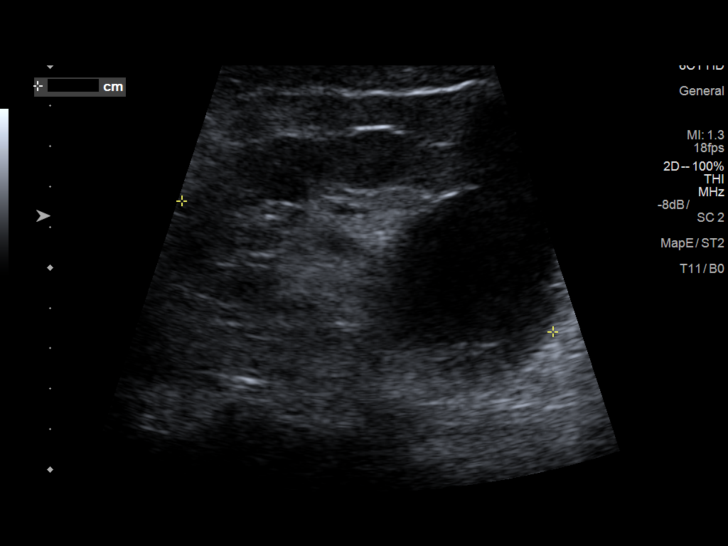
[im 41/50]
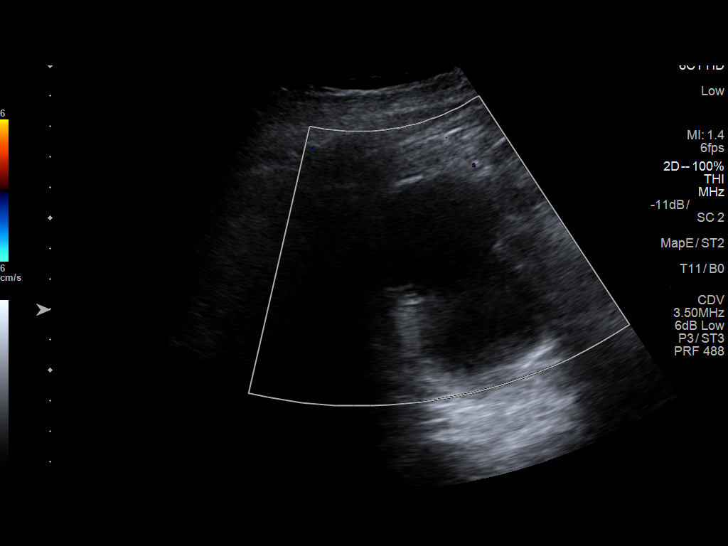
[im 45/50]
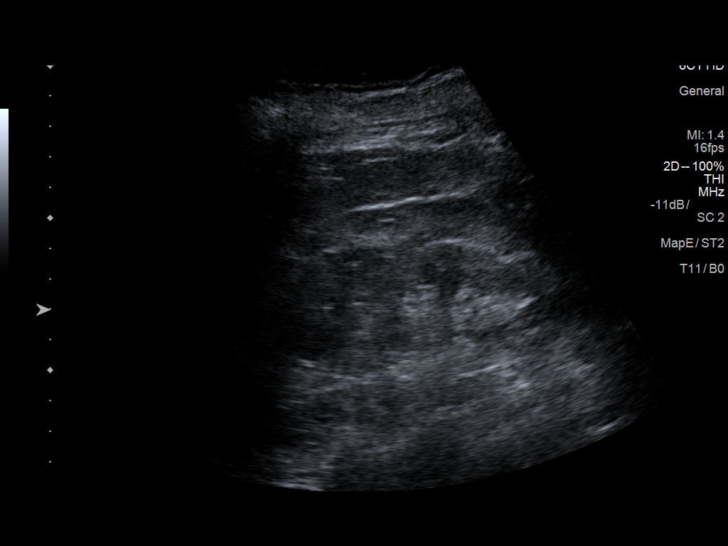
[im 50/50]
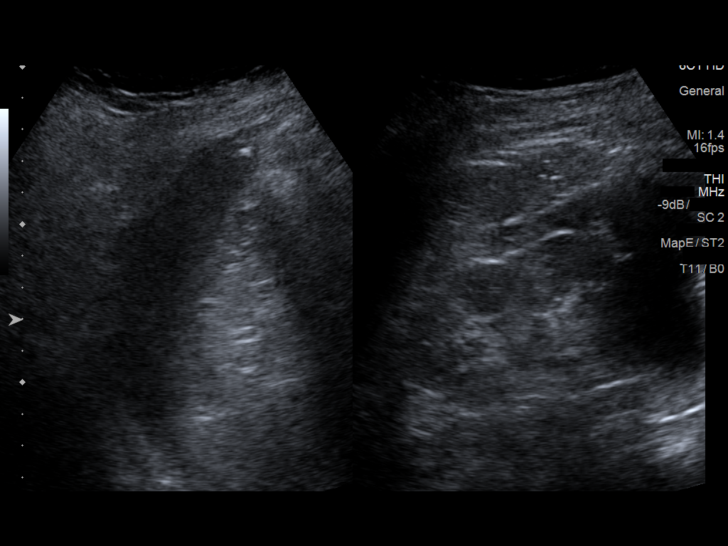

[14 of 25 positions shown; findings below may reference images not displayed]

FINDINGS: Right Kidney:

Length: 11.7 cm. 1.5 cm cyst is seen in upper pole. 1.9 cm cyst is
seen in midpole. Echogenicity within normal limits. No mass or
hydronephrosis visualized.

Left Kidney:

Length: 9.7 cm. Lobulated cyst measuring 7.3 x 5.2 x 0.5 cm is noted
arising from lower pole. Echogenicity within normal limits. No mass
or hydronephrosis visualized.

Bladder:

Debris is noted in the dependent portion of the bladder with
multiple bladder diverticula present. Enlarged prostate gland is
noted.
IMPRESSION: Bilateral renal cysts. No hydronephrosis or renal obstruction is
noted. Debris is noted within the urinary bladder with multiple
bladder diverticulum present. Enlarged prostate gland is noted.

## 2016-07-13 DIAGNOSIS — E119 Type 2 diabetes mellitus without complications: Secondary | ICD-10-CM | POA: Diagnosis not present

## 2016-07-13 DIAGNOSIS — F0150 Vascular dementia without behavioral disturbance: Secondary | ICD-10-CM | POA: Diagnosis not present

## 2016-07-13 DIAGNOSIS — E785 Hyperlipidemia, unspecified: Secondary | ICD-10-CM | POA: Diagnosis not present

## 2016-07-13 DIAGNOSIS — I10 Essential (primary) hypertension: Secondary | ICD-10-CM | POA: Diagnosis not present

## 2016-07-13 DIAGNOSIS — E039 Hypothyroidism, unspecified: Secondary | ICD-10-CM | POA: Diagnosis not present

## 2016-07-13 DIAGNOSIS — N390 Urinary tract infection, site not specified: Secondary | ICD-10-CM | POA: Diagnosis not present

## 2016-07-15 DIAGNOSIS — N390 Urinary tract infection, site not specified: Secondary | ICD-10-CM | POA: Diagnosis not present

## 2016-07-15 DIAGNOSIS — E119 Type 2 diabetes mellitus without complications: Secondary | ICD-10-CM | POA: Diagnosis not present

## 2016-07-15 DIAGNOSIS — E785 Hyperlipidemia, unspecified: Secondary | ICD-10-CM | POA: Diagnosis not present

## 2016-07-15 DIAGNOSIS — F0150 Vascular dementia without behavioral disturbance: Secondary | ICD-10-CM | POA: Diagnosis not present

## 2016-07-15 DIAGNOSIS — I10 Essential (primary) hypertension: Secondary | ICD-10-CM | POA: Diagnosis not present

## 2016-07-15 DIAGNOSIS — E039 Hypothyroidism, unspecified: Secondary | ICD-10-CM | POA: Diagnosis not present

## 2016-07-21 DIAGNOSIS — I10 Essential (primary) hypertension: Secondary | ICD-10-CM | POA: Diagnosis not present

## 2016-07-21 DIAGNOSIS — E785 Hyperlipidemia, unspecified: Secondary | ICD-10-CM | POA: Diagnosis not present

## 2016-07-21 DIAGNOSIS — F0150 Vascular dementia without behavioral disturbance: Secondary | ICD-10-CM | POA: Diagnosis not present

## 2016-07-21 DIAGNOSIS — N390 Urinary tract infection, site not specified: Secondary | ICD-10-CM | POA: Diagnosis not present

## 2016-07-21 DIAGNOSIS — E119 Type 2 diabetes mellitus without complications: Secondary | ICD-10-CM | POA: Diagnosis not present

## 2016-07-21 DIAGNOSIS — E039 Hypothyroidism, unspecified: Secondary | ICD-10-CM | POA: Diagnosis not present

## 2016-07-23 DIAGNOSIS — E039 Hypothyroidism, unspecified: Secondary | ICD-10-CM | POA: Diagnosis not present

## 2016-07-23 DIAGNOSIS — E119 Type 2 diabetes mellitus without complications: Secondary | ICD-10-CM | POA: Diagnosis not present

## 2016-07-23 DIAGNOSIS — E785 Hyperlipidemia, unspecified: Secondary | ICD-10-CM | POA: Diagnosis not present

## 2016-07-23 DIAGNOSIS — N390 Urinary tract infection, site not specified: Secondary | ICD-10-CM | POA: Diagnosis not present

## 2016-07-23 DIAGNOSIS — I10 Essential (primary) hypertension: Secondary | ICD-10-CM | POA: Diagnosis not present

## 2016-07-23 DIAGNOSIS — F0150 Vascular dementia without behavioral disturbance: Secondary | ICD-10-CM | POA: Diagnosis not present

## 2016-07-27 DIAGNOSIS — F0150 Vascular dementia without behavioral disturbance: Secondary | ICD-10-CM | POA: Diagnosis not present

## 2016-07-27 DIAGNOSIS — N390 Urinary tract infection, site not specified: Secondary | ICD-10-CM | POA: Diagnosis not present

## 2016-07-27 DIAGNOSIS — E039 Hypothyroidism, unspecified: Secondary | ICD-10-CM | POA: Diagnosis not present

## 2016-07-27 DIAGNOSIS — E119 Type 2 diabetes mellitus without complications: Secondary | ICD-10-CM | POA: Diagnosis not present

## 2016-07-27 DIAGNOSIS — I10 Essential (primary) hypertension: Secondary | ICD-10-CM | POA: Diagnosis not present

## 2016-07-27 DIAGNOSIS — E785 Hyperlipidemia, unspecified: Secondary | ICD-10-CM | POA: Diagnosis not present

## 2016-07-29 DIAGNOSIS — I10 Essential (primary) hypertension: Secondary | ICD-10-CM | POA: Diagnosis not present

## 2016-07-29 DIAGNOSIS — E039 Hypothyroidism, unspecified: Secondary | ICD-10-CM | POA: Diagnosis not present

## 2016-07-29 DIAGNOSIS — E119 Type 2 diabetes mellitus without complications: Secondary | ICD-10-CM | POA: Diagnosis not present

## 2016-07-29 DIAGNOSIS — E785 Hyperlipidemia, unspecified: Secondary | ICD-10-CM | POA: Diagnosis not present

## 2016-07-29 DIAGNOSIS — F0150 Vascular dementia without behavioral disturbance: Secondary | ICD-10-CM | POA: Diagnosis not present

## 2016-07-29 DIAGNOSIS — N390 Urinary tract infection, site not specified: Secondary | ICD-10-CM | POA: Diagnosis not present

## 2016-11-23 DIAGNOSIS — N390 Urinary tract infection, site not specified: Secondary | ICD-10-CM | POA: Diagnosis not present

## 2016-11-23 DIAGNOSIS — R531 Weakness: Secondary | ICD-10-CM | POA: Diagnosis not present

## 2016-11-23 DIAGNOSIS — R509 Fever, unspecified: Secondary | ICD-10-CM | POA: Diagnosis not present

## 2016-11-24 DIAGNOSIS — R2681 Unsteadiness on feet: Secondary | ICD-10-CM | POA: Diagnosis not present

## 2016-11-24 DIAGNOSIS — I252 Old myocardial infarction: Secondary | ICD-10-CM | POA: Diagnosis not present

## 2016-11-24 DIAGNOSIS — R0600 Dyspnea, unspecified: Secondary | ICD-10-CM | POA: Diagnosis not present

## 2016-11-24 DIAGNOSIS — R410 Disorientation, unspecified: Secondary | ICD-10-CM | POA: Diagnosis not present

## 2016-11-24 DIAGNOSIS — N390 Urinary tract infection, site not specified: Secondary | ICD-10-CM | POA: Diagnosis not present

## 2016-11-24 DIAGNOSIS — E785 Hyperlipidemia, unspecified: Secondary | ICD-10-CM | POA: Diagnosis not present

## 2016-11-24 DIAGNOSIS — R41841 Cognitive communication deficit: Secondary | ICD-10-CM | POA: Diagnosis not present

## 2016-11-24 DIAGNOSIS — M6281 Muscle weakness (generalized): Secondary | ICD-10-CM | POA: Diagnosis not present

## 2016-11-24 DIAGNOSIS — R531 Weakness: Secondary | ICD-10-CM | POA: Diagnosis not present

## 2016-11-24 DIAGNOSIS — E119 Type 2 diabetes mellitus without complications: Secondary | ICD-10-CM | POA: Diagnosis not present

## 2016-11-24 DIAGNOSIS — I2510 Atherosclerotic heart disease of native coronary artery without angina pectoris: Secondary | ICD-10-CM | POA: Diagnosis not present

## 2016-11-24 DIAGNOSIS — N400 Enlarged prostate without lower urinary tract symptoms: Secondary | ICD-10-CM | POA: Diagnosis not present

## 2016-11-24 DIAGNOSIS — J189 Pneumonia, unspecified organism: Secondary | ICD-10-CM | POA: Diagnosis not present

## 2016-11-24 DIAGNOSIS — I5032 Chronic diastolic (congestive) heart failure: Secondary | ICD-10-CM | POA: Diagnosis not present

## 2016-11-24 DIAGNOSIS — Z7901 Long term (current) use of anticoagulants: Secondary | ICD-10-CM | POA: Diagnosis not present

## 2016-11-24 DIAGNOSIS — E039 Hypothyroidism, unspecified: Secondary | ICD-10-CM | POA: Diagnosis not present

## 2016-11-24 DIAGNOSIS — Z955 Presence of coronary angioplasty implant and graft: Secondary | ICD-10-CM | POA: Diagnosis not present

## 2016-11-24 DIAGNOSIS — R509 Fever, unspecified: Secondary | ICD-10-CM | POA: Diagnosis not present

## 2016-11-24 DIAGNOSIS — B954 Other streptococcus as the cause of diseases classified elsewhere: Secondary | ICD-10-CM | POA: Diagnosis not present

## 2016-11-24 DIAGNOSIS — F0281 Dementia in other diseases classified elsewhere with behavioral disturbance: Secondary | ICD-10-CM | POA: Diagnosis not present

## 2016-11-24 DIAGNOSIS — R262 Difficulty in walking, not elsewhere classified: Secondary | ICD-10-CM | POA: Diagnosis not present

## 2016-11-24 DIAGNOSIS — F0150 Vascular dementia without behavioral disturbance: Secondary | ICD-10-CM | POA: Diagnosis not present

## 2016-11-24 DIAGNOSIS — I10 Essential (primary) hypertension: Secondary | ICD-10-CM | POA: Diagnosis not present

## 2016-11-24 DIAGNOSIS — R278 Other lack of coordination: Secondary | ICD-10-CM | POA: Diagnosis not present

## 2016-11-24 DIAGNOSIS — R05 Cough: Secondary | ICD-10-CM | POA: Diagnosis not present

## 2016-11-24 DIAGNOSIS — J159 Unspecified bacterial pneumonia: Secondary | ICD-10-CM | POA: Diagnosis not present

## 2016-12-02 DIAGNOSIS — R531 Weakness: Secondary | ICD-10-CM | POA: Diagnosis not present

## 2016-12-02 DIAGNOSIS — R262 Difficulty in walking, not elsewhere classified: Secondary | ICD-10-CM | POA: Diagnosis not present

## 2016-12-02 DIAGNOSIS — M6281 Muscle weakness (generalized): Secondary | ICD-10-CM | POA: Diagnosis not present

## 2016-12-02 DIAGNOSIS — I10 Essential (primary) hypertension: Secondary | ICD-10-CM | POA: Diagnosis not present

## 2016-12-02 DIAGNOSIS — J189 Pneumonia, unspecified organism: Secondary | ICD-10-CM | POA: Diagnosis not present

## 2016-12-02 DIAGNOSIS — R41841 Cognitive communication deficit: Secondary | ICD-10-CM | POA: Diagnosis not present

## 2016-12-02 DIAGNOSIS — N400 Enlarged prostate without lower urinary tract symptoms: Secondary | ICD-10-CM | POA: Diagnosis not present

## 2016-12-02 DIAGNOSIS — F0281 Dementia in other diseases classified elsewhere with behavioral disturbance: Secondary | ICD-10-CM | POA: Diagnosis not present

## 2016-12-02 DIAGNOSIS — E039 Hypothyroidism, unspecified: Secondary | ICD-10-CM | POA: Diagnosis not present

## 2016-12-02 DIAGNOSIS — J159 Unspecified bacterial pneumonia: Secondary | ICD-10-CM | POA: Diagnosis not present

## 2016-12-02 DIAGNOSIS — R2681 Unsteadiness on feet: Secondary | ICD-10-CM | POA: Diagnosis not present

## 2016-12-02 DIAGNOSIS — R509 Fever, unspecified: Secondary | ICD-10-CM | POA: Diagnosis not present

## 2016-12-02 DIAGNOSIS — N390 Urinary tract infection, site not specified: Secondary | ICD-10-CM | POA: Diagnosis not present

## 2016-12-02 DIAGNOSIS — E119 Type 2 diabetes mellitus without complications: Secondary | ICD-10-CM | POA: Diagnosis not present

## 2016-12-02 DIAGNOSIS — R278 Other lack of coordination: Secondary | ICD-10-CM | POA: Diagnosis not present

## 2016-12-06 DIAGNOSIS — I10 Essential (primary) hypertension: Secondary | ICD-10-CM | POA: Diagnosis not present

## 2016-12-06 DIAGNOSIS — R509 Fever, unspecified: Secondary | ICD-10-CM | POA: Diagnosis not present

## 2016-12-06 DIAGNOSIS — N390 Urinary tract infection, site not specified: Secondary | ICD-10-CM | POA: Diagnosis not present

## 2016-12-06 DIAGNOSIS — J189 Pneumonia, unspecified organism: Secondary | ICD-10-CM | POA: Diagnosis not present

## 2016-12-07 DIAGNOSIS — R531 Weakness: Secondary | ICD-10-CM | POA: Diagnosis not present

## 2016-12-07 DIAGNOSIS — E119 Type 2 diabetes mellitus without complications: Secondary | ICD-10-CM | POA: Diagnosis not present

## 2016-12-07 DIAGNOSIS — I10 Essential (primary) hypertension: Secondary | ICD-10-CM | POA: Diagnosis not present

## 2016-12-09 DIAGNOSIS — R531 Weakness: Secondary | ICD-10-CM | POA: Diagnosis not present

## 2016-12-09 DIAGNOSIS — I10 Essential (primary) hypertension: Secondary | ICD-10-CM | POA: Diagnosis not present

## 2016-12-09 DIAGNOSIS — E119 Type 2 diabetes mellitus without complications: Secondary | ICD-10-CM | POA: Diagnosis not present

## 2016-12-21 DIAGNOSIS — R531 Weakness: Secondary | ICD-10-CM | POA: Diagnosis not present

## 2016-12-21 DIAGNOSIS — N390 Urinary tract infection, site not specified: Secondary | ICD-10-CM | POA: Diagnosis not present

## 2016-12-21 DIAGNOSIS — J189 Pneumonia, unspecified organism: Secondary | ICD-10-CM | POA: Diagnosis not present

## 2016-12-23 DIAGNOSIS — M6281 Muscle weakness (generalized): Secondary | ICD-10-CM | POA: Diagnosis not present

## 2016-12-23 DIAGNOSIS — R531 Weakness: Secondary | ICD-10-CM | POA: Diagnosis not present

## 2016-12-23 DIAGNOSIS — I10 Essential (primary) hypertension: Secondary | ICD-10-CM | POA: Diagnosis not present

## 2016-12-23 DIAGNOSIS — I2510 Atherosclerotic heart disease of native coronary artery without angina pectoris: Secondary | ICD-10-CM | POA: Diagnosis not present

## 2016-12-23 DIAGNOSIS — E119 Type 2 diabetes mellitus without complications: Secondary | ICD-10-CM | POA: Diagnosis not present

## 2016-12-23 DIAGNOSIS — F0150 Vascular dementia without behavioral disturbance: Secondary | ICD-10-CM | POA: Diagnosis not present

## 2016-12-27 DIAGNOSIS — R531 Weakness: Secondary | ICD-10-CM | POA: Diagnosis not present

## 2016-12-27 DIAGNOSIS — I10 Essential (primary) hypertension: Secondary | ICD-10-CM | POA: Diagnosis not present

## 2016-12-27 DIAGNOSIS — M6281 Muscle weakness (generalized): Secondary | ICD-10-CM | POA: Diagnosis not present

## 2016-12-27 DIAGNOSIS — I2510 Atherosclerotic heart disease of native coronary artery without angina pectoris: Secondary | ICD-10-CM | POA: Diagnosis not present

## 2016-12-27 DIAGNOSIS — F0150 Vascular dementia without behavioral disturbance: Secondary | ICD-10-CM | POA: Diagnosis not present

## 2016-12-27 DIAGNOSIS — E119 Type 2 diabetes mellitus without complications: Secondary | ICD-10-CM | POA: Diagnosis not present

## 2016-12-28 DIAGNOSIS — I10 Essential (primary) hypertension: Secondary | ICD-10-CM | POA: Diagnosis not present

## 2016-12-28 DIAGNOSIS — E119 Type 2 diabetes mellitus without complications: Secondary | ICD-10-CM | POA: Diagnosis not present

## 2016-12-28 DIAGNOSIS — R531 Weakness: Secondary | ICD-10-CM | POA: Diagnosis not present

## 2016-12-28 DIAGNOSIS — F0150 Vascular dementia without behavioral disturbance: Secondary | ICD-10-CM | POA: Diagnosis not present

## 2016-12-28 DIAGNOSIS — I2510 Atherosclerotic heart disease of native coronary artery without angina pectoris: Secondary | ICD-10-CM | POA: Diagnosis not present

## 2016-12-28 DIAGNOSIS — M6281 Muscle weakness (generalized): Secondary | ICD-10-CM | POA: Diagnosis not present

## 2016-12-29 DIAGNOSIS — I2510 Atherosclerotic heart disease of native coronary artery without angina pectoris: Secondary | ICD-10-CM | POA: Diagnosis not present

## 2016-12-29 DIAGNOSIS — I10 Essential (primary) hypertension: Secondary | ICD-10-CM | POA: Diagnosis not present

## 2016-12-29 DIAGNOSIS — E119 Type 2 diabetes mellitus without complications: Secondary | ICD-10-CM | POA: Diagnosis not present

## 2016-12-29 DIAGNOSIS — R531 Weakness: Secondary | ICD-10-CM | POA: Diagnosis not present

## 2016-12-29 DIAGNOSIS — M6281 Muscle weakness (generalized): Secondary | ICD-10-CM | POA: Diagnosis not present

## 2016-12-29 DIAGNOSIS — F0150 Vascular dementia without behavioral disturbance: Secondary | ICD-10-CM | POA: Diagnosis not present

## 2016-12-30 DIAGNOSIS — I2510 Atherosclerotic heart disease of native coronary artery without angina pectoris: Secondary | ICD-10-CM | POA: Diagnosis not present

## 2016-12-30 DIAGNOSIS — E119 Type 2 diabetes mellitus without complications: Secondary | ICD-10-CM | POA: Diagnosis not present

## 2016-12-30 DIAGNOSIS — F0150 Vascular dementia without behavioral disturbance: Secondary | ICD-10-CM | POA: Diagnosis not present

## 2016-12-30 DIAGNOSIS — I10 Essential (primary) hypertension: Secondary | ICD-10-CM | POA: Diagnosis not present

## 2016-12-30 DIAGNOSIS — R531 Weakness: Secondary | ICD-10-CM | POA: Diagnosis not present

## 2016-12-30 DIAGNOSIS — M6281 Muscle weakness (generalized): Secondary | ICD-10-CM | POA: Diagnosis not present

## 2017-01-11 DIAGNOSIS — F0150 Vascular dementia without behavioral disturbance: Secondary | ICD-10-CM | POA: Diagnosis not present

## 2017-01-11 DIAGNOSIS — E119 Type 2 diabetes mellitus without complications: Secondary | ICD-10-CM | POA: Diagnosis not present

## 2017-01-11 DIAGNOSIS — R531 Weakness: Secondary | ICD-10-CM | POA: Diagnosis not present

## 2017-01-11 DIAGNOSIS — I2510 Atherosclerotic heart disease of native coronary artery without angina pectoris: Secondary | ICD-10-CM | POA: Diagnosis not present

## 2017-01-11 DIAGNOSIS — M6281 Muscle weakness (generalized): Secondary | ICD-10-CM | POA: Diagnosis not present

## 2017-01-11 DIAGNOSIS — I10 Essential (primary) hypertension: Secondary | ICD-10-CM | POA: Diagnosis not present

## 2017-01-12 DIAGNOSIS — R531 Weakness: Secondary | ICD-10-CM | POA: Diagnosis not present

## 2017-01-12 DIAGNOSIS — I2510 Atherosclerotic heart disease of native coronary artery without angina pectoris: Secondary | ICD-10-CM | POA: Diagnosis not present

## 2017-01-12 DIAGNOSIS — E119 Type 2 diabetes mellitus without complications: Secondary | ICD-10-CM | POA: Diagnosis not present

## 2017-01-12 DIAGNOSIS — M6281 Muscle weakness (generalized): Secondary | ICD-10-CM | POA: Diagnosis not present

## 2017-01-12 DIAGNOSIS — F0150 Vascular dementia without behavioral disturbance: Secondary | ICD-10-CM | POA: Diagnosis not present

## 2017-01-12 DIAGNOSIS — I10 Essential (primary) hypertension: Secondary | ICD-10-CM | POA: Diagnosis not present

## 2017-01-19 DIAGNOSIS — I10 Essential (primary) hypertension: Secondary | ICD-10-CM | POA: Diagnosis not present

## 2017-01-19 DIAGNOSIS — M6281 Muscle weakness (generalized): Secondary | ICD-10-CM | POA: Diagnosis not present

## 2017-01-19 DIAGNOSIS — I2510 Atherosclerotic heart disease of native coronary artery without angina pectoris: Secondary | ICD-10-CM | POA: Diagnosis not present

## 2017-01-19 DIAGNOSIS — R531 Weakness: Secondary | ICD-10-CM | POA: Diagnosis not present

## 2017-01-19 DIAGNOSIS — E119 Type 2 diabetes mellitus without complications: Secondary | ICD-10-CM | POA: Diagnosis not present

## 2017-01-19 DIAGNOSIS — F0150 Vascular dementia without behavioral disturbance: Secondary | ICD-10-CM | POA: Diagnosis not present

## 2017-02-03 DIAGNOSIS — M6281 Muscle weakness (generalized): Secondary | ICD-10-CM | POA: Diagnosis not present

## 2017-02-03 DIAGNOSIS — F0150 Vascular dementia without behavioral disturbance: Secondary | ICD-10-CM | POA: Diagnosis not present

## 2017-02-03 DIAGNOSIS — R531 Weakness: Secondary | ICD-10-CM | POA: Diagnosis not present

## 2017-02-03 DIAGNOSIS — I10 Essential (primary) hypertension: Secondary | ICD-10-CM | POA: Diagnosis not present

## 2017-02-03 DIAGNOSIS — E119 Type 2 diabetes mellitus without complications: Secondary | ICD-10-CM | POA: Diagnosis not present

## 2017-02-03 DIAGNOSIS — I2510 Atherosclerotic heart disease of native coronary artery without angina pectoris: Secondary | ICD-10-CM | POA: Diagnosis not present

## 2017-02-11 DIAGNOSIS — F0150 Vascular dementia without behavioral disturbance: Secondary | ICD-10-CM | POA: Diagnosis not present

## 2017-02-11 DIAGNOSIS — I2510 Atherosclerotic heart disease of native coronary artery without angina pectoris: Secondary | ICD-10-CM | POA: Diagnosis not present

## 2017-02-11 DIAGNOSIS — E119 Type 2 diabetes mellitus without complications: Secondary | ICD-10-CM | POA: Diagnosis not present

## 2017-02-11 DIAGNOSIS — M6281 Muscle weakness (generalized): Secondary | ICD-10-CM | POA: Diagnosis not present

## 2017-02-11 DIAGNOSIS — R531 Weakness: Secondary | ICD-10-CM | POA: Diagnosis not present

## 2017-02-11 DIAGNOSIS — I10 Essential (primary) hypertension: Secondary | ICD-10-CM | POA: Diagnosis not present

## 2017-02-15 DIAGNOSIS — F0390 Unspecified dementia without behavioral disturbance: Secondary | ICD-10-CM | POA: Diagnosis not present

## 2017-02-15 DIAGNOSIS — E119 Type 2 diabetes mellitus without complications: Secondary | ICD-10-CM | POA: Diagnosis not present

## 2017-02-15 DIAGNOSIS — N390 Urinary tract infection, site not specified: Secondary | ICD-10-CM | POA: Diagnosis not present

## 2017-02-15 DIAGNOSIS — I10 Essential (primary) hypertension: Secondary | ICD-10-CM | POA: Diagnosis not present

## 2017-02-17 DIAGNOSIS — E039 Hypothyroidism, unspecified: Secondary | ICD-10-CM | POA: Diagnosis not present

## 2017-02-17 DIAGNOSIS — N390 Urinary tract infection, site not specified: Secondary | ICD-10-CM | POA: Diagnosis not present

## 2017-02-17 DIAGNOSIS — E119 Type 2 diabetes mellitus without complications: Secondary | ICD-10-CM | POA: Diagnosis not present

## 2017-02-18 DIAGNOSIS — E119 Type 2 diabetes mellitus without complications: Secondary | ICD-10-CM | POA: Diagnosis not present

## 2017-02-18 DIAGNOSIS — I10 Essential (primary) hypertension: Secondary | ICD-10-CM | POA: Diagnosis not present

## 2017-02-18 DIAGNOSIS — I2510 Atherosclerotic heart disease of native coronary artery without angina pectoris: Secondary | ICD-10-CM | POA: Diagnosis not present

## 2017-02-18 DIAGNOSIS — R531 Weakness: Secondary | ICD-10-CM | POA: Diagnosis not present

## 2017-02-18 DIAGNOSIS — F0150 Vascular dementia without behavioral disturbance: Secondary | ICD-10-CM | POA: Diagnosis not present

## 2017-02-18 DIAGNOSIS — M6281 Muscle weakness (generalized): Secondary | ICD-10-CM | POA: Diagnosis not present

## 2017-02-21 DIAGNOSIS — R531 Weakness: Secondary | ICD-10-CM | POA: Diagnosis not present

## 2017-02-21 DIAGNOSIS — I2510 Atherosclerotic heart disease of native coronary artery without angina pectoris: Secondary | ICD-10-CM | POA: Diagnosis not present

## 2017-02-21 DIAGNOSIS — F0150 Vascular dementia without behavioral disturbance: Secondary | ICD-10-CM | POA: Diagnosis not present

## 2017-02-21 DIAGNOSIS — M6281 Muscle weakness (generalized): Secondary | ICD-10-CM | POA: Diagnosis not present

## 2017-02-21 DIAGNOSIS — E119 Type 2 diabetes mellitus without complications: Secondary | ICD-10-CM | POA: Diagnosis not present

## 2017-02-21 DIAGNOSIS — I10 Essential (primary) hypertension: Secondary | ICD-10-CM | POA: Diagnosis not present

## 2017-02-25 DIAGNOSIS — I2510 Atherosclerotic heart disease of native coronary artery without angina pectoris: Secondary | ICD-10-CM | POA: Diagnosis not present

## 2017-02-25 DIAGNOSIS — F0150 Vascular dementia without behavioral disturbance: Secondary | ICD-10-CM | POA: Diagnosis not present

## 2017-02-25 DIAGNOSIS — R531 Weakness: Secondary | ICD-10-CM | POA: Diagnosis not present

## 2017-02-25 DIAGNOSIS — E119 Type 2 diabetes mellitus without complications: Secondary | ICD-10-CM | POA: Diagnosis not present

## 2017-02-25 DIAGNOSIS — I10 Essential (primary) hypertension: Secondary | ICD-10-CM | POA: Diagnosis not present

## 2017-02-25 DIAGNOSIS — M6281 Muscle weakness (generalized): Secondary | ICD-10-CM | POA: Diagnosis not present

## 2017-03-15 DIAGNOSIS — E785 Hyperlipidemia, unspecified: Secondary | ICD-10-CM | POA: Diagnosis not present

## 2017-03-15 DIAGNOSIS — I10 Essential (primary) hypertension: Secondary | ICD-10-CM | POA: Diagnosis not present

## 2017-03-15 DIAGNOSIS — I25118 Atherosclerotic heart disease of native coronary artery with other forms of angina pectoris: Secondary | ICD-10-CM | POA: Diagnosis not present

## 2017-03-15 DIAGNOSIS — F0150 Vascular dementia without behavioral disturbance: Secondary | ICD-10-CM | POA: Diagnosis not present

## 2017-03-15 DIAGNOSIS — E119 Type 2 diabetes mellitus without complications: Secondary | ICD-10-CM | POA: Diagnosis not present

## 2018-09-30 DEATH — deceased
